# Patient Record
Sex: Female | Born: 1955
Health system: Southern US, Community
[De-identification: ages and names within clinical notes are randomized; demographics above are authoritative.]

## PROBLEM LIST (undated history)

## (undated) DIAGNOSIS — K219 Gastro-esophageal reflux disease without esophagitis: Secondary | ICD-10-CM

## (undated) DIAGNOSIS — K8020 Calculus of gallbladder without cholecystitis without obstruction: Secondary | ICD-10-CM

## (undated) DIAGNOSIS — E785 Hyperlipidemia, unspecified: Secondary | ICD-10-CM

## (undated) DIAGNOSIS — T7840XA Allergy, unspecified, initial encounter: Secondary | ICD-10-CM

## (undated) DIAGNOSIS — F32A Depression, unspecified: Secondary | ICD-10-CM

## (undated) DIAGNOSIS — D649 Anemia, unspecified: Secondary | ICD-10-CM

## (undated) DIAGNOSIS — F411 Generalized anxiety disorder: Secondary | ICD-10-CM

## (undated) DIAGNOSIS — M810 Age-related osteoporosis without current pathological fracture: Secondary | ICD-10-CM

## (undated) HISTORY — DX: Allergy, unspecified, initial encounter: T78.40XA

## (undated) HISTORY — DX: Generalized anxiety disorder: F41.1

## (undated) HISTORY — DX: Calculus of gallbladder without cholecystitis without obstruction: K80.20

## (undated) HISTORY — PX: COLONOSCOPY: SHX174

## (undated) HISTORY — PX: TONSILLECTOMY: SUR1361

## (undated) HISTORY — DX: Depression, unspecified: F32.A

## (undated) HISTORY — PX: CHOLECYSTECTOMY: SHX55

## (undated) HISTORY — DX: Gastro-esophageal reflux disease without esophagitis: K21.9

## (undated) HISTORY — PX: UPPER GASTROINTESTINAL ENDOSCOPY: SHX188

## (undated) HISTORY — PX: BREAST CYST EXCISION: SHX579

## (undated) HISTORY — PX: APPENDECTOMY: SHX54

## (undated) HISTORY — DX: Age-related osteoporosis without current pathological fracture: M81.0

## (undated) HISTORY — DX: Anemia, unspecified: D64.9

## (undated) HISTORY — DX: Hyperlipidemia, unspecified: E78.5

## (undated) HISTORY — PX: HYSTERECTOMY ABDOMINAL WITH SALPINGO-OOPHORECTOMY: SHX6792

---

## 2019-12-11 ENCOUNTER — Ambulatory Visit: Payer: BC Managed Care – PPO | Attending: Internal Medicine

## 2019-12-11 DIAGNOSIS — Z23 Encounter for immunization: Secondary | ICD-10-CM

## 2019-12-11 NOTE — Progress Notes (Signed)
   Covid-19 Vaccination Clinic  Name:  [name]    MRN: [id number] DOB: June 09, 1956  12/11/2019  [name] was observed post Covid-19 immunization for 15 minutes without incident. She was provided with Vaccine Information Sheet and instruction to access the V-Safe system.   [name] was instructed to call 911 with any severe reactions post vaccine: Marland Kitchen Difficulty breathing  . Swelling of face and throat  . A fast heartbeat  . A bad rash all over body  . Dizziness and weakness   Immunizations Administered    Name Date Dose VIS Date Route   Pfizer COVID-19 Vaccine 12/11/2019  9:17 AM 0.3 mL 08/21/2019 Intramuscular   Manufacturer: [employer], [employer]   Lot: [id number]   NDC: [address]-6

## 2019-12-28 ENCOUNTER — Ambulatory Visit: Payer: BC Managed Care – PPO | Admitting: Family Medicine

## 2019-12-28 ENCOUNTER — Other Ambulatory Visit: Payer: Self-pay

## 2019-12-28 ENCOUNTER — Encounter: Payer: Self-pay | Admitting: Family Medicine

## 2019-12-28 VITALS — BP 110/68 | HR 72 | Temp 97.3°F | Wt 162.6 lb

## 2019-12-28 DIAGNOSIS — E785 Hyperlipidemia, unspecified: Secondary | ICD-10-CM | POA: Insufficient documentation

## 2019-12-28 DIAGNOSIS — K219 Gastro-esophageal reflux disease without esophagitis: Secondary | ICD-10-CM | POA: Insufficient documentation

## 2019-12-28 DIAGNOSIS — F419 Anxiety disorder, unspecified: Secondary | ICD-10-CM | POA: Insufficient documentation

## 2019-12-28 DIAGNOSIS — M8580 Other specified disorders of bone density and structure, unspecified site: Secondary | ICD-10-CM | POA: Diagnosis not present

## 2019-12-28 DIAGNOSIS — G4700 Insomnia, unspecified: Secondary | ICD-10-CM | POA: Diagnosis not present

## 2019-12-28 DIAGNOSIS — L729 Follicular cyst of the skin and subcutaneous tissue, unspecified: Secondary | ICD-10-CM | POA: Insufficient documentation

## 2019-12-28 DIAGNOSIS — F17200 Nicotine dependence, unspecified, uncomplicated: Secondary | ICD-10-CM

## 2019-12-28 MED ORDER — ZOLPIDEM TARTRATE 5 MG PO TABS: 5.0000 mg | ORAL_TABLET | Freq: Every evening | ORAL | 1 refills | Status: DC | PRN

## 2019-12-28 NOTE — Progress Notes (Signed)
Subjective:    Patient ID: Kari Rojas, female    DOB: [birth year], 64 y.o.   MRN: [id number]  HPI Chief Complaint  Patient presents with  . new pt    new pt, est, lump on stomach- been there a couple months.    She is new to the practice and here to get established. Moved here from [facility] November 2020. No medical records are with her today.  Previous medical care: PCP in Dr. Estelle June  Originally from [place]. Her daughter and grandchildren moved here.   Other providers: GI in [facility] for colonoscopy and EGD.   Complains of a mass on her right forearm and a knot on her upper abdomen. These are non tender. No erythema or increase in size. Unclear how long they have been present.   HL- taking lovastatin and doing fine.  States she had an echo and EKG 3 years ago and states it was fine.   Hx of GERD- took Prilosec for a while. Now her reflux is under control.   States she had a very stressful job in [employer] and has been taking Lexapro for anxiety and stress. States she would like to wean off of this. Reports her stress has improved since moving here and no longer working.   Insomnia- has been taking Ambien 10 mg for the past 2 years. States she does not take it nightly. States lately she has been taking 5 mg of Ambien instead of 10 mg.  Has not tried any other medications.   Osteopenia vs osteoporosis - she took alendronate but it caused her reflux to become worse. States she did not qualify for Prolia.   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   Smokes 1/2 ppd.   Hysterectomy due to heavy bleeding and fiboids.    Reviewed allergies, medications, past medical, surgical, family, and social history.    Review of Systems Pertinent positives and negatives in the history of present illness.      Objective:   Physical Exam BP 110/68   Pulse 72   Temp (!) 97.3 F (36.3 C)   Wt 162 lb 9.6 oz (73.8 kg)    Alert and in no distress. Cardiac exam shows a regular sinus rhythm without murmurs or gallops. Lungs are clear to auscultation. She has a small round, smooth, moveable and non tender cyst on the upper abdomen. A soft fatty tumor on her right forearm.       Assessment & Plan:  Insomnia, unspecified type - Plan: zolpidem (AMBIEN) 5 MG tablet -discussed reducing her Ambien dose to 5 mg and have her try to wean off. Counseling done on good sleep hygiene. She is napping during the day and I recommend that she stop. Reviewed PDMP.   Anxiety -she would like to wean off of Lexapro. We discussed how to safely do this.   Decreased bone density -unclear as to whether she has osteopenia vs osteoporosis. States she had side effects with alendronate and had to stop it. I will request records from her previous PCP.   Hyperlipidemia, unspecified hyperlipidemia type -taking lovastatin daily. She will return for a fasting CPE in the next 2 months. Continue statin therapy   Cyst of skin-discussed that the upper abdominal cyst is benign appearing. Appears to have a lipoma on her right forearm.   Gastroesophageal reflux disease, unspecified whether esophagitis present -no longer an issue.   Smoker -recommend she stop

## 2019-12-28 NOTE — Patient Instructions (Signed)
Try weaning off the Lexapro by taking 1/2 tablet daily for 1-2 weeks and then 1/2 tablet every other day for at least 1 week before stopping.   Also, I recommend weaning down to 5mg  of the Ambien and then only using it a couple of nights per week and eventually stopping as well.   Use good sleep hygiene as we discussed. Avoid naps during the day.   Follow up with me in the next 2 months for a physical exam and follow up.

## 2020-01-01 ENCOUNTER — Telehealth: Payer: Self-pay | Admitting: Family Medicine

## 2020-01-01 NOTE — Telephone Encounter (Signed)
Received requested records from Dr. Murvin Natal.

## 2020-01-04 ENCOUNTER — Ambulatory Visit: Payer: BC Managed Care – PPO | Attending: Internal Medicine

## 2020-01-04 DIAGNOSIS — Z23 Encounter for immunization: Secondary | ICD-10-CM

## 2020-01-04 NOTE — Progress Notes (Signed)
   Covid-19 Vaccination Clinic  Name:  Kari Rojas    MRN: [id number] DOB: [birth year]  01/04/2020  [name] was observed post Covid-19 immunization for 15 minutes without incident. She was provided with Vaccine Information Sheet and instruction to access the V-Safe system.   [name] was instructed to call 911 with any severe reactions post vaccine: Marland Kitchen Difficulty breathing  . Swelling of face and throat  . A fast heartbeat  . A bad rash all over body  . Dizziness and weakness   Immunizations Administered    Name Date Dose VIS Date Route   Pfizer COVID-19 Vaccine 01/04/2020 11:54 AM 0.3 mL 11/04/2018 Intramuscular   Manufacturer: [employer], [employer]   Lot: [id number]   NDC: [address]-1

## 2020-01-08 ENCOUNTER — Encounter: Payer: Self-pay | Admitting: Family Medicine

## 2020-02-29 NOTE — Progress Notes (Signed)
Subjective:    Patient ID: Kari Rojas, female    DOB: 12-26-1955, 64 y.o.   MRN: [id number]  HPI Chief Complaint  Patient presents with  . fasting cpe    fasting cpe, will find obgyn and will give list   She is fairly new to the practice and here for a complete physical exam. Previous medical care: in [place]. Dr. Marjean Donna  Last CPE: years ago   Other providers: None   Mixed hyperlipidemia- taking a statin daily   Insomnia- states she weaned off Ambien and is doing well.   Depression- states she is taking Lexapro 1/2 tablet every other day. At some point wants to stop it.   Osteoporosis - diagnosed at age 51. Family hx of osteoporosis.  Calcium- Women's One A Day  Vitamin D def- taking a supplement.  Exercise- walks and stairs  Has tried alendronate in the past but it caused severe GI issues. States Prolia was not covered by insurance so she never took it.   GERD- takes omeprazole daily. Endoscopy in [place] in 2012  Echocardiogram 2017 normal results  Requests triamcinolone refill for mosquito bites.    Social history: Lives her daughter and is divorced, has 2 children, retired. Former Programmer, applications in [employer] Smoking 1/2 pack per day, drinking alcohol, drug use  Diet: gets calcium  Excerise: daily walks  Immunizations: Tdap 2020 at an urgent care. Shingles vaccine both doses at [facility] in 2020  Health maintenance:  Mammogram: August 2020 in [facility] Colonoscopy: due this year. This was in [facility]  Last Gynecological Exam: hysterectomy.  DEXA: more than 2 years ago per patient in [facility].   Last Dental Exam: 6 months  Last Eye Exam: years ago   Wears seatbelt always, uses sunscreen, smoke detectors in home and functioning, does not text while driving and feels safe in home environment.   Depression screen [facility] 2/9 03/01/2020 12/28/2019  Decreased Interest 0 0  Down, Depressed, Hopeless 0 0  PHQ - 2 Score 0 0     Reviewed allergies, medications, past medical, surgical, family,  and social history.   Review of Systems Review of Systems Constitutional: -fever, -chills, -sweats, -unexpected weight change,-fatigue ENT: -runny nose, -ear pain, -sore throat Cardiology:  -chest pain, -palpitations, -edema Respiratory: -cough, -shortness of breath, -wheezing Gastroenterology: -abdominal pain, -nausea, -vomiting, -diarrhea, -constipation  Hematology: -bleeding or bruising problems Musculoskeletal: -arthralgias, -myalgias, -joint swelling, -back pain Ophthalmology: -vision changes Urology: -dysuria, -difficulty urinating, -hematuria, -urinary frequency, -urgency Neurology: -headache, -weakness, -tingling, -numbness       Objective:   Physical Exam BP 120/80   Pulse 79   Ht 5\' 5"  (1.651 m)   Wt 164 lb (74.4 kg)   BMI 27.29 kg/m   General Appearance:    Alert, cooperative, no distress, appears stated age  Head:    Normocephalic, without obvious abnormality, atraumatic  Eyes:    PERRL, conjunctiva/corneas clear, EOM's intact  Ears:    Normal TM's and external ear canals  Nose:   Mask in place   Throat:   Mask in place   Neck:   Supple, no lymphadenopathy;  thyroid:  no   enlargement/tenderness/nodules; no JVD  Back:    Spine nontender, no curvature, ROM normal, no CVA     tenderness  Lungs:     Clear to auscultation bilaterally without wheezes, rales or     ronchi; respirations unlabored  Chest Wall:    No tenderness or deformity   Heart:  Regular rate and rhythm, S1 and S2 normal, no murmur, rub   or gallop  Breast Exam:    No tenderness, masses, or nipple discharge or inversion.      No axillary lymphadenopathy  Abdomen:     Soft, non-tender, nondistended, normoactive bowel sounds,    no masses, no hepatosplenomegaly  Genitalia:    Normal external genitalia without lesions.  BUS and vagina normal; cervix not present due to hysterectomy. No abnormal vaginal discharge.  adnexa not enlarged, nontender, no masses.  Pap not indicated      Extremities:   No  clubbing, cyanosis or edema  Pulses:   2+ and symmetric all extremities  Skin:   Skin color, texture, turgor normal, no rashes or lesions  Lymph nodes:   Cervical, supraclavicular, and axillary nodes normal  Neurologic:   CNII-XII intact, normal strength, sensation and gait; reflexes 2+ and symmetric throughout          Psych:   Normal mood, affect, hygiene and grooming.         Assessment & Plan:  Routine general medical examination at a health care facility - Plan: CBC with Differential/Platelet, Comprehensive metabolic panel, TSH, T4, free, Lipid panel -She is fairly new to me and here today for a CPE.  Preventive health care reviewed.  She will call and schedule her mammogram and bone density.  She is due for her colonoscopy and I will refer her to GI.  No results available for previous colonoscopy or mammogram or bone density due to these being done in [place].  I have requested these.  Counseling on healthy lifestyle including diet, exercise, smoking cessation.  Discussed safety and health promotion.  Immunizations reviewed.  Hyperlipidemia, unspecified hyperlipidemia type - Plan: Lipid panel -Continue statin therapy.  Follow-up pending lipid results  Age-related osteoporosis without current pathological fracture - Plan: DG Bone Density -No record on file for her last bone density which was done in [place].  She will call and schedule her bone density.  She reportedly has tried alendronate in the past but this caused severe gastritis.  Prolia was not affordable at the time.  Discussed depending on her results, we may need to consider trying Prolia or another medication.  Encouraged adequate calcium intake, vitamin D supplement and weightbearing exercises  Gastroesophageal reflux disease, unspecified whether esophagitis present -Continue taking PPI as needed.  Try to avoid this daily  Smoker -Encourage smoking cessation.  She is not ready at this time  Anxiety- States her mood is good  and she is currently taking half a dose of Lexapro and plans to wean off at some point.  Insomnia, unspecified type -She stopped taking Ambien.  Sleeping fine.  Vitamin D deficiency - Plan: VITAMIN D 25 Hydroxy (Vit-D Deficiency, Fractures) -Continue on current supplement.  Follow-up pending vitamin D level and adjust dose as appropriate.  Screen for colon cancer - Plan: Ambulatory referral to Gastroenterology -Referral to GI for screening colonoscopy.  She reports having a normal colonoscopy in [place] over 10 years ago.  Encounter for screening mammogram for malignant neoplasm of breast - Plan: MM DIGITAL SCREENING BILATERAL -She will call and schedule her mammogram.  Last 1 was in [place]  Estrogen deficiency - Plan: DG Bone Density -History of osteoporosis.  I do not have records of her last bone density.  She will call and schedule her DEXA  Need for hepatitis C screening test - Plan: Hepatitis C antibody -Done per screening guidelines

## 2020-02-29 NOTE — Patient Instructions (Addendum)
Call and schedule your mammogram and bone density for August when you are due.  You will hear from [facility] to schedule a visit to discuss your colonoscopy.  I will be in touch with your lab results.  Dermatology offices  [facility]: Phone #: [phone] Address: [address], [place],  [place] [address]  [facility]: Phone: [phone]  Address: [address], [place], [place] [address]  [facility]: [phone] Address: [address] [place], [place] [address]  [facility] Address: [place], [place], [place] [address] Phone: [phone]    Preventive Care 27-4 Years Old, Female Preventive care refers to visits with your health care provider and lifestyle choices that can promote health and wellness. This includes:  A yearly physical exam. This may also be called an annual well check.  Regular dental visits and eye exams.  Immunizations.  Screening for certain conditions.  Healthy lifestyle choices, such as eating a healthy diet, getting regular exercise, not using drugs or products that contain nicotine and tobacco, and limiting alcohol use. What can I expect for my preventive care visit? Physical exam Your health care provider will check your:  Height and weight. This may be used to calculate body mass index (BMI), which tells if you are at a healthy weight.  Heart rate and blood pressure.  Skin for abnormal spots. Counseling Your health care provider may ask you questions about your:  Alcohol, tobacco, and drug use.  Emotional well-being.  Home and relationship well-being.  Sexual activity.  Eating habits.  Work and work Statistician.  Method of birth control.  Menstrual cycle.  Pregnancy history. What immunizations do I need?  Influenza (flu) vaccine  This is recommended every year. Tetanus, diphtheria, and pertussis (Tdap) vaccine  You may need a Td booster every 10  years. Varicella (chickenpox) vaccine  You may need this if you have not been vaccinated. Zoster (shingles) vaccine  You may need this after age 37. Measles, mumps, and rubella (MMR) vaccine  You may need at least one dose of MMR if you were born in 1957 or later. You may also need a second dose. Pneumococcal conjugate (PCV13) vaccine  You may need this if you have certain conditions and were not previously vaccinated. Pneumococcal polysaccharide (PPSV23) vaccine  You may need one or two doses if you smoke cigarettes or if you have certain conditions. Meningococcal conjugate (MenACWY) vaccine  You may need this if you have certain conditions. Hepatitis A vaccine  You may need this if you have certain conditions or if you travel or work in places where you may be exposed to hepatitis A. Hepatitis B vaccine  You may need this if you have certain conditions or if you travel or work in places where you may be exposed to hepatitis B. Haemophilus influenzae type b (Hib) vaccine  You may need this if you have certain conditions. Human papillomavirus (HPV) vaccine  If recommended by your health care provider, you may need three doses over 6 months. You may receive vaccines as individual doses or as more than one vaccine together in one shot (combination vaccines). Talk with your health care provider about the risks and benefits of combination vaccines. What tests do I need? Blood tests  Lipid and cholesterol levels. These may be checked every 5 years, or more frequently if you are over 65 years old.  Hepatitis C test.  Hepatitis B test. Screening  Lung cancer screening. You may have this screening every  year starting at age 71 if you have a 30-pack-year history of smoking and currently smoke or have quit within the past 15 years.  Colorectal cancer screening. All adults should have this screening starting at age 96 and continuing until age 82. Your health care provider may  recommend screening at age 80 if you are at increased risk. You will have tests every 1-10 years, depending on your results and the type of screening test.  Diabetes screening. This is done by checking your blood sugar (glucose) after you have not eaten for a while (fasting). You may have this done every 1-3 years.  Mammogram. This may be done every 1-2 years. Talk with your health care provider about when you should start having regular mammograms. This may depend on whether you have a family history of breast cancer.  BRCA-related cancer screening. This may be done if you have a family history of breast, ovarian, tubal, or peritoneal cancers.  Pelvic exam and Pap test. This may be done every 3 years starting at age 49. Starting at age 58, this may be done every 5 years if you have a Pap test in combination with an HPV test. Other tests  Sexually transmitted disease (STD) testing.  Bone density scan. This is done to screen for osteoporosis. You may have this scan if you are at high risk for osteoporosis. Follow these instructions at home: Eating and drinking  Eat a diet that includes fresh fruits and vegetables, whole grains, lean protein, and low-fat dairy.  Take vitamin and mineral supplements as recommended by your health care provider.  Do not drink alcohol if: ? Your health care provider tells you not to drink. ? You are pregnant, may be pregnant, or are planning to become pregnant.  If you drink alcohol: ? Limit how much you have to 0-1 drink a day. ? Be aware of how much alcohol is in your drink. In the [place] one drink equals one 12 oz bottle of beer (355 mL), one 5 oz glass of wine (148 mL), or one 1 oz glass of hard liquor (44 mL). Lifestyle  Take daily care of your teeth and gums.  Stay active. Exercise for at least 30 minutes on 5 or more days each week.  Do not use any products that contain nicotine or tobacco, such as cigarettes, e-cigarettes, and chewing tobacco. If  you need help quitting, ask your health care provider.  If you are sexually active, practice safe sex. Use a condom or other form of birth control (contraception) in order to prevent pregnancy and STIs (sexually transmitted infections).  If told by your health care provider, take low-dose aspirin daily starting at age 71. What's next?  Visit your health care provider once a year for a well check visit.  Ask your health care provider how often you should have your eyes and teeth checked.  Stay up to date on all vaccines. This information is not intended to replace advice given to you by your health care provider. Make sure you discuss any questions you have with your health care provider. Document Revised: 05/08/2018 Document Reviewed: 05/08/2018 Elsevier Patient Education  2020 [employer].

## 2020-03-01 ENCOUNTER — Encounter: Payer: Self-pay | Admitting: Gastroenterology

## 2020-03-01 ENCOUNTER — Encounter: Payer: Self-pay | Admitting: Family Medicine

## 2020-03-01 ENCOUNTER — Ambulatory Visit (INDEPENDENT_AMBULATORY_CARE_PROVIDER_SITE_OTHER): Payer: BC Managed Care – PPO | Admitting: Family Medicine

## 2020-03-01 ENCOUNTER — Other Ambulatory Visit: Payer: Self-pay

## 2020-03-01 VITALS — BP 120/80 | HR 79 | Ht 65.0 in | Wt 164.0 lb

## 2020-03-01 DIAGNOSIS — K219 Gastro-esophageal reflux disease without esophagitis: Secondary | ICD-10-CM | POA: Diagnosis not present

## 2020-03-01 DIAGNOSIS — Z1231 Encounter for screening mammogram for malignant neoplasm of breast: Secondary | ICD-10-CM | POA: Diagnosis not present

## 2020-03-01 DIAGNOSIS — E559 Vitamin D deficiency, unspecified: Secondary | ICD-10-CM | POA: Diagnosis not present

## 2020-03-01 DIAGNOSIS — E785 Hyperlipidemia, unspecified: Secondary | ICD-10-CM | POA: Diagnosis not present

## 2020-03-01 DIAGNOSIS — M810 Age-related osteoporosis without current pathological fracture: Secondary | ICD-10-CM | POA: Insufficient documentation

## 2020-03-01 DIAGNOSIS — Z0000 Encounter for general adult medical examination without abnormal findings: Secondary | ICD-10-CM | POA: Diagnosis not present

## 2020-03-01 DIAGNOSIS — Z1159 Encounter for screening for other viral diseases: Secondary | ICD-10-CM | POA: Diagnosis not present

## 2020-03-01 DIAGNOSIS — G4700 Insomnia, unspecified: Secondary | ICD-10-CM

## 2020-03-01 DIAGNOSIS — E2839 Other primary ovarian failure: Secondary | ICD-10-CM

## 2020-03-01 DIAGNOSIS — F419 Anxiety disorder, unspecified: Secondary | ICD-10-CM

## 2020-03-01 DIAGNOSIS — Z1211 Encounter for screening for malignant neoplasm of colon: Secondary | ICD-10-CM | POA: Diagnosis not present

## 2020-03-01 DIAGNOSIS — F17200 Nicotine dependence, unspecified, uncomplicated: Secondary | ICD-10-CM | POA: Diagnosis not present

## 2020-03-01 MED ORDER — TRIAMCINOLONE ACETONIDE 0.1 % EX CREA: 1.0000 "application " | TOPICAL_CREAM | Freq: Two times a day (BID) | CUTANEOUS | 0 refills | Status: DC

## 2020-03-02 LAB — COMPREHENSIVE METABOLIC PANEL
ALT: 13 IU/L (ref 0–32)
AST: 17 IU/L (ref 0–40)
Albumin/Globulin Ratio: 1.9 (ref 1.2–2.2)
Albumin: 4.5 g/dL (ref 3.8–4.8)
Alkaline Phosphatase: 54 IU/L (ref 48–121)
BUN/Creatinine Ratio: 23 (ref 12–28)
BUN: 17 mg/dL (ref 8–27)
Bilirubin Total: 0.4 mg/dL (ref 0.0–1.2)
CO2: 23 mmol/L (ref 20–29)
Calcium: 9.7 mg/dL (ref 8.7–10.3)
Chloride: 104 mmol/L (ref 96–106)
Creatinine, Ser: 0.73 mg/dL (ref 0.57–1.00)
GFR calc Af Amer: 103 mL/min/{1.73_m2} (ref >59)
GFR calc non Af Amer: 89 mL/min/{1.73_m2} (ref >59)
Globulin, Total: 2.4 g/dL (ref 1.5–4.5)
Glucose: 95 mg/dL (ref 65–99)
Potassium: 4.4 mmol/L (ref 3.5–5.2)
Sodium: 140 mmol/L (ref 134–144)
Total Protein: 6.9 g/dL (ref 6.0–8.5)

## 2020-03-02 LAB — LIPID PANEL
Chol/HDL Ratio: 3.3 ratio (ref 0.0–4.4)
Cholesterol, Total: 196 mg/dL (ref 100–199)
HDL: 60 mg/dL (ref >39)
LDL Chol Calc (NIH): 120 mg/dL — ABNORMAL HIGH (ref 0–99)
Triglycerides: 86 mg/dL (ref 0–149)
VLDL Cholesterol Cal: 16 mg/dL (ref 5–40)

## 2020-03-02 LAB — CBC WITH DIFFERENTIAL/PLATELET
Basophils Absolute: 0.1 10*3/uL (ref 0.0–0.2)
Basos: 1 %
EOS (ABSOLUTE): 0.3 10*3/uL (ref 0.0–0.4)
Eos: 3 %
Hematocrit: 41.7 % (ref 34.0–46.6)
Hemoglobin: 14.3 g/dL (ref 11.1–15.9)
Immature Grans (Abs): 0 10*3/uL (ref 0.0–0.1)
Immature Granulocytes: 0 %
Lymphocytes Absolute: 2.2 10*3/uL (ref 0.7–3.1)
Lymphs: 27 %
MCH: 30.9 pg (ref 26.6–33.0)
MCHC: 34.3 g/dL (ref 31.5–35.7)
MCV: 90 fL (ref 79–97)
Monocytes Absolute: 0.5 10*3/uL (ref 0.1–0.9)
Monocytes: 6 %
Neutrophils Absolute: 5.1 10*3/uL (ref 1.4–7.0)
Neutrophils: 63 %
Platelets: 285 10*3/uL (ref 150–450)
RBC: 4.63 x10E6/uL (ref 3.77–5.28)
RDW: 13.8 % (ref 11.7–15.4)
WBC: 8.2 10*3/uL (ref 3.4–10.8)

## 2020-03-02 LAB — TSH: TSH: 1.93 u[IU]/mL (ref 0.450–4.500)

## 2020-03-02 LAB — T4, FREE: Free T4: 1.04 ng/dL (ref 0.82–1.77)

## 2020-03-02 LAB — VITAMIN D 25 HYDROXY (VIT D DEFICIENCY, FRACTURES): Vit D, 25-Hydroxy: 32 ng/mL (ref 30.0–100.0)

## 2020-03-02 LAB — HEPATITIS C ANTIBODY: Hep C Virus Ab: <0.1 s/co ratio (ref 0.0–0.9)

## 2020-03-02 NOTE — Progress Notes (Signed)
Your labs look quite good including blood counts, electrolytes, kidney, liver, thyroid and vitamin D levels.  Her good cholesterol or HDL is in a good range but her LDL or bad cholesterol is slightly elevated.  Make sure she is limiting fatty foods and fried foods and getting at least 150 minutes of physical activity each week to help with her LDL She is negative for hepatitis C

## 2020-04-08 ENCOUNTER — Encounter: Payer: Self-pay | Admitting: Gastroenterology

## 2020-04-08 ENCOUNTER — Ambulatory Visit (AMBULATORY_SURGERY_CENTER): Payer: Self-pay | Admitting: *Deleted

## 2020-04-08 ENCOUNTER — Other Ambulatory Visit: Payer: Self-pay

## 2020-04-08 VITALS — Ht 65.0 in | Wt 163.0 lb

## 2020-04-08 DIAGNOSIS — Z1211 Encounter for screening for malignant neoplasm of colon: Secondary | ICD-10-CM

## 2020-04-08 MED ORDER — SUTAB 1479-225-188 MG PO TABS: 24.0000 | ORAL_TABLET | ORAL | 0 refills | Status: DC

## 2020-04-08 NOTE — Progress Notes (Signed)
cov vacc x 2   No egg or soy allergy known to patient  No issues with past sedation with any surgeries or procedures no intubation problems in the past  No diet pills per patient No home 02 use per patient  No blood thinners per patient  Pt denies issues with constipation  No A fib or A flutter  EMMI video to pt or MyChart  COVID 19 guidelines implemented in [facility] today   sutab  Coupon given to pt in [facility] today, code to pharmacy    Due to the COVID-19 pandemic we are asking patients to follow these guidelines. Please only bring one care partner. Please be aware that your care partner may wait in the car in the parking lot or if they feel like they will be too hot to wait in the car, they may wait in the lobby on the 4th floor. All care partners are required to wear a mask the entire time (we do not have any that we can provide them), they need to practice social distancing, and we will do a Covid check for all patient's and care partners when you arrive. Also we will check their temperature and your temperature. If the care partner waits in their car they need to stay in the parking lot the entire time and we will call them on their cell phone when the patient is ready for discharge so they can bring the car to the front of the building. Also all patient's will need to wear a mask into building.

## 2020-04-28 ENCOUNTER — Encounter: Payer: Self-pay | Admitting: Gastroenterology

## 2020-04-28 ENCOUNTER — Ambulatory Visit (AMBULATORY_SURGERY_CENTER): Payer: BC Managed Care – PPO | Admitting: Gastroenterology

## 2020-04-28 ENCOUNTER — Other Ambulatory Visit: Payer: Self-pay

## 2020-04-28 VITALS — BP 106/59 | HR 72 | Temp 98.7°F | Resp 16 | Ht 65.0 in | Wt 163.0 lb

## 2020-04-28 DIAGNOSIS — Z1211 Encounter for screening for malignant neoplasm of colon: Secondary | ICD-10-CM | POA: Diagnosis not present

## 2020-04-28 MED ORDER — SODIUM CHLORIDE 0.9 % IV SOLN: 500.0000 mL | Freq: Once | INTRAVENOUS | Status: DC

## 2020-04-28 NOTE — Progress Notes (Signed)
No problems noted in the recovery room. maw 

## 2020-04-28 NOTE — Patient Instructions (Signed)
Handouts were given to you on diverticulosis and hemorrhoids.e You may resume your current medications today. Repeat colonoscopy in 10 years for screening purposes. Please call if any questions or concerns.     YOU HAD AN ENDOSCOPIC PROCEDURE TODAY AT THE [facility]:   Refer to the procedure report that was given to you for any specific questions about what was found during the examination.  If the procedure report does not answer your questions, please call your gastroenterologist to clarify.  If you requested that your care partner not be given the details of your procedure findings, then the procedure report has been included in a sealed envelope for you to review at your convenience later.  YOU SHOULD EXPECT: Some feelings of bloating in the abdomen. Passage of more gas than usual.  Walking can help get rid of the air that was put into your GI tract during the procedure and reduce the bloating. If you had a lower endoscopy (such as a colonoscopy or flexible sigmoidoscopy) you may notice spotting of blood in your stool or on the toilet paper. If you underwent a bowel prep for your procedure, you may not have a normal bowel movement for a few days.  Please Note:  You might notice some irritation and congestion in your nose or some drainage.  This is from the oxygen used during your procedure.  There is no need for concern and it should clear up in a day or so.  SYMPTOMS TO REPORT IMMEDIATELY:   Following lower endoscopy (colonoscopy or flexible sigmoidoscopy):  Excessive amounts of blood in the stool  Significant tenderness or worsening of abdominal pains  Swelling of the abdomen that is new, acute  Fever of 100F or higher    For urgent or emergent issues, a gastroenterologist can be reached at any hour by calling [phone]. Do not use MyChart messaging for urgent concerns.    DIET:  We do recommend a small meal at first, but then you may proceed to your regular  diet.  Drink plenty of fluids but you should avoid alcoholic beverages for 24 hours.  ACTIVITY:  You should plan to take it easy for the rest of today and you should NOT DRIVE or use heavy machinery until tomorrow (because of the sedation medicines used during the test).    FOLLOW UP: Our staff will call the number listed on your records 48-72 hours following your procedure to check on you and address any questions or concerns that you may have regarding the information given to you following your procedure. If we do not reach you, we will leave a message.  We will attempt to reach you two times.  During this call, we will ask if you have developed any symptoms of COVID 19. If you develop any symptoms (ie: fever, flu-like symptoms, shortness of breath, cough etc.) before then, please call [phone].  If you test positive for Covid 19 in the 2 weeks post procedure, please call and report this information to [place].    If any biopsies were taken you will be contacted by phone or by letter within the next 1-3 weeks.  Please call us at [phone] if you have not heard about the biopsies in 3 weeks.    SIGNATURES/CONFIDENTIALITY: You and/or your care partner have signed paperwork which will be entered into your electronic medical record.  These signatures attest to the fact that that the information above on your After Visit Summary has been reviewed and  is understood.  Full responsibility of the confidentiality of this discharge information lies with you and/or your care-partner.

## 2020-04-28 NOTE — Progress Notes (Signed)
Report to PACU, RN, vss, BBS= Clear.  

## 2020-04-28 NOTE — Op Note (Signed)
[facility] Patient Name: Tritia Endo Procedure Date: 04/28/2020 11:07 AM MRN: [id number] Endoscopist: Napoleon Form , MD Age: 64 Referring MD:  Date of Birth: 1955-10-30 Gender: Female Account #: [id number] Procedure:                Colonoscopy Indications:              Screening for colorectal malignant neoplasm Medicines:                Monitored Anesthesia Care Procedure:                Pre-Anesthesia Assessment:                           - Prior to the procedure, a History and Physical                            was performed, and patient medications and                            allergies were reviewed. The patient's tolerance of                            previous anesthesia was also reviewed. The risks                            and benefits of the procedure and the sedation                            options and risks were discussed with the patient.                            All questions were answered, and informed consent                            was obtained. Prior Anticoagulants: The patient has                            taken no previous anticoagulant or antiplatelet                            agents. ASA Grade Assessment: II - A patient with                            mild systemic disease. After reviewing the risks                            and benefits, the patient was deemed in                            satisfactory condition to undergo the procedure.                           After obtaining informed consent, the colonoscope  was passed under direct vision. Throughout the                            procedure, the patient's blood pressure, pulse, and                            oxygen saturations were monitored continuously. The                            Colonoscope was introduced through the anus and                            advanced to the the cecum, identified by                            appendiceal  orifice and ileocecal valve. The                            colonoscopy was performed without difficulty. The                            patient tolerated the procedure well. The quality                            of the bowel preparation was good. The ileocecal                            valve, appendiceal orifice, and rectum were                            photographed. Scope In: 11:15:26 AM Scope Out: 11:29:57 AM Scope Withdrawal Time: 0 hours 10 minutes 48 seconds  Total Procedure Duration: 0 hours 14 minutes 31 seconds  Findings:                 The perianal and digital rectal examinations were                            normal.                           Multiple small-mouthed diverticula were found in                            the sigmoid colon.                           Non-bleeding internal hemorrhoids were found during                            retroflexion. The hemorrhoids were small.                           The exam was otherwise without abnormality. Complications:            No immediate complications. Estimated Blood Loss:     Estimated  blood loss was minimal. Impression:               - Diverticulosis in the sigmoid colon.                           - Non-bleeding internal hemorrhoids.                           - The examination was otherwise normal.                           - No specimens collected. Recommendation:           - Patient has a contact number available for                            emergencies. The signs and symptoms of potential                            delayed complications were discussed with the                            patient. Return to normal activities tomorrow.                            Written discharge instructions were provided to the                            patient.                           - Resume previous diet.                           - Continue present medications.                           - Repeat colonoscopy in 10 years  for screening                            purposes. Napoleon Form, MD 04/28/2020 11:33:19 AM This report has been signed electronically.

## 2020-05-02 ENCOUNTER — Telehealth: Payer: Self-pay

## 2020-05-02 NOTE — Telephone Encounter (Signed)
  Follow up Call-  Call back number 04/28/2020  Post procedure Call Back phone  # [phone]  Permission to leave phone message Yes     Patient questions:  Do you have a fever, pain , or abdominal swelling? No. Pain Score  0 *  Have you tolerated food without any problems? Yes.    Have you been able to return to your normal activities? Yes.    Do you have any questions about your discharge instructions: Diet   No. Medications  No. Follow up visit  No.  Do you have questions or concerns about your Care? No.  Actions: * If pain score is 4 or above: 1. No action needed, pain <4.Have you developed a fever since your procedure? no  2.   Have you had an respiratory symptoms (SOB or cough) since your procedure? no  3.   Have you tested positive for COVID 19 since your procedure no  4.   Have you had any family members/close contacts diagnosed with the COVID 19 since your procedure?  no   If yes to any of these questions please route to Laverna Peace, RN and Karlton Lemon, RN

## 2020-05-04 ENCOUNTER — Other Ambulatory Visit: Payer: Self-pay

## 2020-05-04 ENCOUNTER — Ambulatory Visit
Admission: RE | Admit: 2020-05-04 | Discharge: 2020-05-04 | Disposition: A | Discharge status: Home / Self Care | Payer: BC Managed Care – PPO | Source: Ambulatory Visit | Attending: Family Medicine | Admitting: Family Medicine

## 2020-05-04 DIAGNOSIS — Z1231 Encounter for screening mammogram for malignant neoplasm of breast: Secondary | ICD-10-CM

## 2020-05-23 ENCOUNTER — Encounter: Payer: Self-pay | Admitting: Family Medicine

## 2020-05-23 ENCOUNTER — Other Ambulatory Visit (INDEPENDENT_AMBULATORY_CARE_PROVIDER_SITE_OTHER): Payer: BC Managed Care – PPO

## 2020-05-23 ENCOUNTER — Telehealth: Payer: BC Managed Care – PPO | Admitting: Family Medicine

## 2020-05-23 ENCOUNTER — Other Ambulatory Visit: Payer: Self-pay

## 2020-05-23 VITALS — Temp 100.8°F | Wt 160.0 lb

## 2020-05-23 DIAGNOSIS — R0602 Shortness of breath: Secondary | ICD-10-CM

## 2020-05-23 DIAGNOSIS — R509 Fever, unspecified: Secondary | ICD-10-CM

## 2020-05-23 DIAGNOSIS — R05 Cough: Secondary | ICD-10-CM

## 2020-05-23 DIAGNOSIS — R52 Pain, unspecified: Secondary | ICD-10-CM | POA: Diagnosis not present

## 2020-05-23 DIAGNOSIS — F17200 Nicotine dependence, unspecified, uncomplicated: Secondary | ICD-10-CM | POA: Diagnosis not present

## 2020-05-23 DIAGNOSIS — R059 Cough, unspecified: Secondary | ICD-10-CM

## 2020-05-23 DIAGNOSIS — R058 Other specified cough: Secondary | ICD-10-CM

## 2020-05-23 LAB — POC COVID19 BINAXNOW: SARS Coronavirus 2 Ag: NEGATIVE

## 2020-05-23 LAB — POCT INFLUENZA A/B
Influenza A, POC: NEGATIVE
Influenza B, POC: NEGATIVE

## 2020-05-23 MED ORDER — GUAIFENESIN-DM 100-10 MG/5ML PO SYRP: 5.0000 mL | ORAL_SOLUTION | ORAL | 0 refills | Status: DC | PRN

## 2020-05-23 MED ORDER — AZITHROMYCIN 250 MG PO TABS: ORAL_TABLET | ORAL | 0 refills | Status: DC

## 2020-05-23 NOTE — Progress Notes (Signed)
° °  Subjective:  Documentation for virtual audio and video telecommunications through Caregility encounter:  She also came to the office for testing, Covid and flu  The patient was located at home. 2 patient identifiers used.  The provider was located in the office. The patient did consent to this visit and is aware of possible charges through their insurance for this visit.  The other persons participating in this telemedicine service were none. Time spent on call was 16 minutes and in review of previous records 20 minutes total.  This virtual service is not related to other E/M service within previous 7 days.   Patient ID: Kari Rojas, female    DOB: 1956/04/30, 64 y.o.   MRN: [id number]  HPI Chief Complaint  Patient presents with   cold symptoms    chest congestion, cough, fever- 101.4, stuffy nose, drainage, some SOB. x 2 days ago   Complains of cold symptoms 2 days ago including rhinorrhea, nasal congestion and then yesterday she developed fever, chills, body aches, cough with yellow, thick sputum, chest congestion and shortness of breath.   She came to the office earlier today for Covid and flu testing which were negative. PCR was sent.   She is fully vaccinated for Covid.  She was around her grandchildren and one was sick with URI symptoms.  Smokes   Hx of pneumonia and bronchitis.   Reviewed allergies, medications, past medical, surgical, family, and social history.  Request prescription cough medication.   No loss of taste or smell.  No sore throat, chest pain, palpitations, abdominal pain, N/V/D.     Review of Systems Pertinent positives and negatives in the history of present illness.     Objective:   Physical Exam Temp (!) 100.8 F (38.2 C)    Wt 160 lb (72.6 kg)    BMI 26.63 kg/m   Alert and oriented and in no acute distress.  Respirations unlabored.  Speaking in complete senses without difficulty.  Normal speech, mood and thought process        Assessment & Plan:  Cough with fever - Plan: azithromycin (ZITHROMAX) 250 MG tablet, guaiFENesin-dextromethorphan (ROBITUSSIN DM) 100-10 MG/5ML syrup  Productive cough  Body aches  Shortness of breath  Smoker  Negative rapid Covid and flu test. PCR Covid testing sent She is not in any acute distress. History of pneumonia, bronchitis and she is a smoker.  I will prescribe a Z-Pak as well as Robitussin-DM.  Discussed supportive care including hydration, Tylenol or ibuprofen, rest.  I also recommend isolation until we have her PCR Covid test back. Encouraged good multivitamins including zinc and vitamin C. Discussed benefit versus risk in regards to starting her on antibiotic this early into her illness. Follow-up pending PCR test and her symptoms.

## 2020-05-24 LAB — NOVEL CORONAVIRUS, NAA: SARS-CoV-2, NAA: NOT DETECTED

## 2020-05-24 LAB — SARS-COV-2, NAA 2 DAY TAT

## 2020-05-25 ENCOUNTER — Telehealth: Payer: Self-pay | Admitting: Family Medicine

## 2020-05-25 MED ORDER — ALBUTEROL SULFATE HFA 108 (90 BASE) MCG/ACT IN AERS: 2.0000 | INHALATION_SPRAY | Freq: Four times a day (QID) | RESPIRATORY_TRACT | 0 refills | Status: DC | PRN

## 2020-05-25 NOTE — Telephone Encounter (Signed)
Pt called and said the meds that you prescribed were working but she is still having trouble breathing so she wants to see if she get an albuterol inhaler called in

## 2020-05-25 NOTE — Telephone Encounter (Signed)
Ok to send in albuterol inhaler but if this does not help, she may need to be seen at an urgent care

## 2020-05-25 NOTE — Telephone Encounter (Signed)
Pt was notified.  

## 2020-06-08 ENCOUNTER — Telehealth: Payer: Self-pay

## 2020-06-08 MED ORDER — LOVASTATIN 10 MG PO TABS
10.0000 mg | ORAL_TABLET | Freq: Every day | ORAL | 5 refills | Status: DC
Start: 2020-06-08 — End: 2020-10-06

## 2020-06-08 NOTE — Telephone Encounter (Signed)
Sent in med

## 2020-06-08 NOTE — Telephone Encounter (Signed)
Pt. Called stating that she needs a refill on her Lovastatin didn't get refilled last time because she still had refills from her previous doctor in [facility]. She does need it ref[facility] now to [facility] [address] Pt. Last apt was 05/23/20 next apt 03/03/21.

## 2020-06-08 NOTE — Telephone Encounter (Signed)
Ok to refill for 6 months 

## 2020-06-12 ENCOUNTER — Other Ambulatory Visit: Payer: Self-pay | Admitting: Family Medicine

## 2020-06-13 MED ORDER — ALBUTEROL SULFATE HFA 108 (90 BASE) MCG/ACT IN AERS: 2.0000 | INHALATION_SPRAY | Freq: Four times a day (QID) | RESPIRATORY_TRACT | 0 refills | Status: DC | PRN

## 2020-06-13 NOTE — Addendum Note (Signed)
Addended by: Jac Canavan on: 06/13/2020 11:00 AM   Modules accepted: Orders

## 2020-06-13 NOTE — Telephone Encounter (Signed)
This was just refill a week ago

## 2020-07-08 ENCOUNTER — Other Ambulatory Visit: Payer: Self-pay | Admitting: Medical

## 2020-10-06 ENCOUNTER — Other Ambulatory Visit: Payer: Self-pay | Admitting: Family Medicine

## 2020-12-01 DIAGNOSIS — M25562 Pain in left knee: Secondary | ICD-10-CM | POA: Diagnosis not present

## 2020-12-29 ENCOUNTER — Other Ambulatory Visit: Payer: Self-pay

## 2020-12-29 ENCOUNTER — Ambulatory Visit (HOSPITAL_COMMUNITY)
Admission: EM | Admit: 2020-12-29 | Discharge: 2020-12-29 | Disposition: A | Discharge status: Home / Self Care | Payer: BC Managed Care – PPO | Attending: Emergency Medicine | Admitting: Emergency Medicine

## 2020-12-29 ENCOUNTER — Encounter (HOSPITAL_COMMUNITY): Payer: Self-pay | Admitting: Emergency Medicine

## 2020-12-29 ENCOUNTER — Ambulatory Visit (HOSPITAL_COMMUNITY): Payer: Self-pay

## 2020-12-29 DIAGNOSIS — R1084 Generalized abdominal pain: Secondary | ICD-10-CM | POA: Diagnosis not present

## 2020-12-29 DIAGNOSIS — K529 Noninfective gastroenteritis and colitis, unspecified: Secondary | ICD-10-CM | POA: Diagnosis not present

## 2020-12-29 LAB — POCT URINALYSIS DIPSTICK, ED / UC
Bilirubin Urine: NEGATIVE
Glucose, UA: NEGATIVE mg/dL
Ketones, ur: NEGATIVE mg/dL
Leukocytes,Ua: NEGATIVE
Nitrite: NEGATIVE
Protein, ur: NEGATIVE mg/dL
Specific Gravity, Urine: 1.01 (ref 1.005–1.030)
Urobilinogen, UA: 0.2 mg/dL (ref 0.0–1.0)
pH: 6 (ref 5.0–8.0)

## 2020-12-29 LAB — CBC
HCT: 41.5 % (ref 36.0–46.0)
Hemoglobin: 14 g/dL (ref 12.0–15.0)
MCH: 30.9 pg (ref 26.0–34.0)
MCHC: 33.7 g/dL (ref 30.0–36.0)
MCV: 91.6 fL (ref 80.0–100.0)
Platelets: 320 10*3/uL (ref 150–400)
RBC: 4.53 MIL/uL (ref 3.87–5.11)
RDW: 13.5 % (ref 11.5–15.5)
WBC: 11.9 10*3/uL — ABNORMAL HIGH (ref 4.0–10.5)
nRBC: 0 % (ref 0.0–0.2)

## 2020-12-29 MED ORDER — METRONIDAZOLE 500 MG PO TABS: 500.0000 mg | ORAL_TABLET | Freq: Three times a day (TID) | ORAL | 0 refills | Status: AC

## 2020-12-29 MED ORDER — CIPROFLOXACIN HCL 500 MG PO TABS: 500.0000 mg | ORAL_TABLET | Freq: Two times a day (BID) | ORAL | 0 refills | Status: AC

## 2020-12-29 NOTE — ED Triage Notes (Signed)
Abdominal pain and bloating for a week.  Took dulcolax and no relief.  Continued with dulcolax, enema, miralax with minimal results and some relief, but symptoms started worsening yesterday.  Pain is lower abdomen and now having left lower back pain.  Denies urinary symptoms

## 2020-12-29 NOTE — Discharge Instructions (Signed)
Take the Cipro twice a day for the next 14 days and the Flagyl 3 times a day for the next 14 days.  Make sure you are drinking plenty of fluids especially water.  Try to increase your dietary fiber intake.  Follow-up with primary care or go to the Emergency Department if symptoms worsen or do not improve in the next few days.

## 2020-12-29 NOTE — ED Provider Notes (Addendum)
[facility]    CSN: [id number] Arrival date & time: 12/29/20  8469      History   Chief Complaint Chief Complaint  Patient presents with  . Abdominal Pain    HPI Kari Rojas is a 65 y.o. female.   Here for evaluation of generalized abdominal pain and bloating that has been ongoing since last Friday.  Reports last normal bowel movement last Thursday.  Reports taking a Dulcolax on Friday with minimal relief.  Has since tried MiraLAX, enemas, and magnesium citrate.  Reports having small stools but no real significant relief.  Denies any vomiting but does report nausea that started this morning.  Reports having the occasional constipation in the past but nothing this severe.  Denies any significant changes to diet. Denies any specific alleviating or aggravating factors.  Denies any fevers, chest pain, shortness of breath, N/V/D, numbness, tingling, weakness, or headaches.   ROS: As per HPI, all other pertinent ROS negative   The history is provided by the patient.    Past Medical History:  Diagnosis Date  . Allergy   . Anemia    younger with menses   . Depression   . GAD (generalized anxiety disorder)   . GERD (gastroesophageal reflux disease)   . Hyperlipidemia   . Osteoporosis     Patient Active Problem List   Diagnosis Date Noted  . Age-related osteoporosis without current pathological fracture 03/01/2020  . Vitamin D deficiency 03/01/2020  . Anxiety 12/28/2019  . Insomnia 12/28/2019  . Decreased bone density 12/28/2019  . Hyperlipidemia 12/28/2019  . Cyst of skin 12/28/2019  . Gastroesophageal reflux disease 12/28/2019  . Smoker 12/28/2019    Past Surgical History:  Procedure Laterality Date  . APPENDECTOMY    . BREAST CYST EXCISION Right    40 + years ago, Benign  . CHOLECYSTECTOMY    . COLONOSCOPY     10 + yrs ago   . HYSTERECTOMY ABDOMINAL WITH SALPINGO-OOPHORECTOMY     has one ovary   . TONSILLECTOMY    . UPPER GASTROINTESTINAL  ENDOSCOPY      OB History   No obstetric history on file.      Home Medications    Prior to Admission medications   Medication Sig Start Date End Date Taking? Authorizing Provider  ciprofloxacin (CIPRO) 500 MG tablet Take 1 tablet (500 mg total) by mouth every 12 (twelve) hours for 14 days. 12/29/20 01/12/21 Yes Ivette Loyal, NP  escitalopram (LEXAPRO) 20 MG tablet Take 20 mg by mouth daily.   Yes [provider]  lovastatin (MEVACOR) 10 MG tablet TAKE 1 TABLET BY MOUTH EVERYDAY AT BEDTIME 10/06/20  Yes Henson, Vickie L, NP-C  metroNIDAZOLE (FLAGYL) 500 MG tablet Take 1 tablet (500 mg total) by mouth 3 (three) times daily for 14 days. 12/29/20 01/12/21 Yes Ivette Loyal, NP  Multiple Vitamins-Minerals (ONE-A-DAY WOMENS PO) Take by mouth.   Yes [provider]  omeprazole (PRILOSEC) 20 MG capsule Take 20 mg by mouth daily.   Yes [provider]  albuterol (VENTOLIN HFA) 108 (90 Base) MCG/ACT inhaler TAKE 2 PUFFS BY MOUTH EVERY 6 HOURS AS NEEDED FOR WHEEZE OR SHORTNESS OF BREATH 07/10/20   Henson, Vickie L, NP-C  guaiFENesin-dextromethorphan (ROBITUSSIN DM) 100-10 MG/5ML syrup Take 5 mLs by mouth every 4 (four) hours as needed for cough. 05/23/20   Henson, Vickie L, NP-C  triamcinolone cream (KENALOG) 0.1 % Apply 1 application topically 2 (two) times daily. 03/01/20   Suezanne Jacquet,  Vickie L, NP-C    Family History Family History  Adopted: Yes    Social History Social History   Tobacco Use  . Smoking status: Current Every Day Smoker    Packs/day: 0.50    Years: 15.00    Pack years: 7.50  . Smokeless tobacco: Never Used  Vaping Use  . Vaping Use: Never used  Substance Use Topics  . Alcohol use: Yes    Comment: occ  . Drug use: Never     Allergies   Latex and Penicillins   Review of Systems Review of Systems  Gastrointestinal: Positive for abdominal distention, abdominal pain, constipation and nausea.  Genitourinary: Positive for flank pain. Negative  for difficulty urinating, dysuria and urgency.  All other systems reviewed and are negative.    Physical Exam Triage Vital Signs ED Triage Vitals  Enc Vitals Group     BP 12/29/20 1001 130/73     Pulse Rate 12/29/20 1001 91     Resp 12/29/20 1001 18     Temp 12/29/20 1001 98 F (36.7 C)     Temp Source 12/29/20 1001 Oral     SpO2 12/29/20 1001 99 %     Weight --      Height --      Head Circumference --      Peak Flow --      Pain Score 12/29/20 0957 6     Pain Loc --      Pain Edu? --      Excl. in GC? --    No data found.  Updated Vital Signs BP 130/73 (BP Location: Right Arm)   Pulse 91   Temp 98 F (36.7 C) (Oral)   Resp 18   SpO2 99%   Visual Acuity Right Eye Distance:   Left Eye Distance:   Bilateral Distance:    Right Eye Near:   Left Eye Near:    Bilateral Near:     Physical Exam Vitals and nursing note reviewed.  Constitutional:      General: She is not in acute distress.    Appearance: Normal appearance. She is not ill-appearing, toxic-appearing or diaphoretic.  HENT:     Head: Normocephalic and atraumatic.  Eyes:     Conjunctiva/sclera: Conjunctivae normal.  Cardiovascular:     Rate and Rhythm: Normal rate.     Pulses: Normal pulses.  Pulmonary:     Effort: Pulmonary effort is normal.  Abdominal:     General: Abdomen is flat. Bowel sounds are normal.     Palpations: Abdomen is soft.     Tenderness: There is generalized abdominal tenderness and tenderness in the epigastric area and suprapubic area. There is guarding. There is no rebound.     Hernia: No hernia is present.  Musculoskeletal:        General: Normal range of motion.     Cervical back: Normal range of motion.  Skin:    General: Skin is warm and dry.  Neurological:     General: No focal deficit present.     Mental Status: She is alert and oriented to person, place, and time.  Psychiatric:        Mood and Affect: Mood normal.      UC Treatments / Results  Labs (all labs  ordered are listed, but only abnormal results are displayed) Labs Reviewed  POCT URINALYSIS DIPSTICK, ED / UC - Abnormal; Notable for the following components:      Result Value  Hgb urine dipstick MODERATE (*)    All other components within normal limits  CBC    EKG   Radiology No results found.  Procedures Procedures (including critical care time)  Medications Ordered in UC Medications - No data to display  Initial Impression / Assessment and Plan / UC Course  I have reviewed the triage vital signs and the nursing notes.  Pertinent labs & imaging results that were available during my care of the patient were reviewed by me and considered in my medical decision making (see chart for details).    Assessment is negative for red flags or concerns including appendicitis.  This is likely colitis or inflammation of the colon.  Urinalysis with no signs of infection. CBC pending, will follow up with abnormal results. Prescribe Cipro and Flagyl for the next 14 days.  Encourage fluid intake and increasing amount of fiber in diet.  Encourage patient to follow-up if symptoms worsen or do not improve in the next few days.  Final Clinical Impressions(s) / UC Diagnoses   Final diagnoses:  Colitis  Generalized abdominal pain     Discharge Instructions     Take the Cipro twice a day for the next 14 days and the Flagyl 3 times a day for the next 14 days.  Make sure you are drinking plenty of fluids especially water.  Try to increase your dietary fiber intake.  Follow-up with primary care or go to the Emergency Department if symptoms worsen or do not improve in the next few days.      ED Prescriptions    Medication Sig Dispense Auth. Provider   ciprofloxacin (CIPRO) 500 MG tablet Take 1 tablet (500 mg total) by mouth every 12 (twelve) hours for 14 days. 28 tablet Chales Salmon R, NP   metroNIDAZOLE (FLAGYL) 500 MG tablet Take 1 tablet (500 mg total) by mouth 3 (three) times daily  for 14 days. 42 tablet Ivette Loyal, NP     PDMP not reviewed this encounter.   Ivette Loyal, NP 12/29/20 1038    Ivette Loyal, NP 12/29/20 1052

## 2021-01-06 ENCOUNTER — Other Ambulatory Visit: Payer: Self-pay | Admitting: Family Medicine

## 2021-01-06 NOTE — Telephone Encounter (Signed)
Has an app in june

## 2021-02-15 ENCOUNTER — Telehealth: Payer: Self-pay | Admitting: Family Medicine

## 2021-02-15 NOTE — Telephone Encounter (Signed)
Called pt to move up appt time on 6/23 and she said ok.  Pt said she needs refill of Citalopram 20 [facility] to [facility] [address]

## 2021-02-16 MED ORDER — ESCITALOPRAM OXALATE 20 MG PO TABS: 20.0000 mg | ORAL_TABLET | Freq: Every day | ORAL | 0 refills | Status: DC

## 2021-02-16 NOTE — Telephone Encounter (Signed)
done

## 2021-02-20 ENCOUNTER — Encounter: Payer: Self-pay | Admitting: Internal Medicine

## 2021-03-02 ENCOUNTER — Encounter: Payer: Self-pay | Admitting: Family Medicine

## 2021-03-03 ENCOUNTER — Encounter: Payer: Self-pay | Admitting: Family Medicine

## 2021-03-03 ENCOUNTER — Other Ambulatory Visit: Payer: Self-pay

## 2021-03-03 ENCOUNTER — Ambulatory Visit: Payer: BC Managed Care – PPO | Admitting: Family Medicine

## 2021-03-03 VITALS — BP 128/80 | HR 80 | Temp 98.3°F | Ht 63.5 in | Wt 159.2 lb

## 2021-03-03 DIAGNOSIS — Z1329 Encounter for screening for other suspected endocrine disorder: Secondary | ICD-10-CM | POA: Diagnosis not present

## 2021-03-03 DIAGNOSIS — F17200 Nicotine dependence, unspecified, uncomplicated: Secondary | ICD-10-CM

## 2021-03-03 DIAGNOSIS — Z0000 Encounter for general adult medical examination without abnormal findings: Secondary | ICD-10-CM | POA: Diagnosis not present

## 2021-03-03 DIAGNOSIS — E785 Hyperlipidemia, unspecified: Secondary | ICD-10-CM

## 2021-03-03 DIAGNOSIS — E559 Vitamin D deficiency, unspecified: Secondary | ICD-10-CM | POA: Diagnosis not present

## 2021-03-03 DIAGNOSIS — Z23 Encounter for immunization: Secondary | ICD-10-CM | POA: Diagnosis not present

## 2021-03-03 DIAGNOSIS — L729 Follicular cyst of the skin and subcutaneous tissue, unspecified: Secondary | ICD-10-CM

## 2021-03-03 DIAGNOSIS — M810 Age-related osteoporosis without current pathological fracture: Secondary | ICD-10-CM | POA: Diagnosis not present

## 2021-03-03 DIAGNOSIS — R319 Hematuria, unspecified: Secondary | ICD-10-CM | POA: Diagnosis not present

## 2021-03-03 DIAGNOSIS — F419 Anxiety disorder, unspecified: Secondary | ICD-10-CM

## 2021-03-03 DIAGNOSIS — E2839 Other primary ovarian failure: Secondary | ICD-10-CM

## 2021-03-03 LAB — POCT URINALYSIS DIP (PROADVANTAGE DEVICE)
Bilirubin, UA: NEGATIVE
Glucose, UA: NEGATIVE mg/dL
Ketones, POC UA: NEGATIVE mg/dL
Leukocytes, UA: NEGATIVE
Nitrite, UA: NEGATIVE
Protein Ur, POC: NEGATIVE mg/dL
Specific Gravity, Urine: 1.015
Urobilinogen, Ur: 0.2
pH, UA: 6.5 (ref 5.0–8.0)

## 2021-03-03 NOTE — Progress Notes (Signed)
Subjective:    Patient ID: Kari Rojas, female    DOB: 15-Nov-1955, 65 y.o.   MRN: [id number]  HPI Chief Complaint  Patient presents with   Annual Exam    CPE lump on rt. arm and stomach no other issues, no obgyn had pap here last year   She is here for a complete physical exam.   Other providers: GI - Dr. Lavon Paganini   Recent issue with colitis and is taking probiotic now.   Mixed hyperlipidemia- taking a statin daily    Insomnia- states she weaned off Ambien and is doing well. has a nightly routine. Takes 5 mg of melatonin.    Anxiety- states she is taking Lexapro 1 tablet daily. doing well. Tried to stop it and anxiety got worse.    Osteoporosis - diagnosed at age 63. Family hx of osteoporosis. Calcium- Women's One A Day Vitamin D def- taking a supplement. Exercise- walks and stairs Has tried alendronate in the past but it caused severe GI issues. States Prolia was not covered by insurance so she never took it. She did not get a DEXA last year as recommended.    GERD- takes omeprazole rarely now.  Endoscopy in [place] in 2012   Echocardiogram 2017 normal results  Check urine due to hx of hematuria.   Working on stopping smoking.   States she is ready to have something done about the cysts under her skin on her right arm and abdominal wall. She plans to [place] dermat[facility]    Social history: Lives with her daughter, is not working Diet: but back on red meat. Eating more fish.  Excerise:   Immunizations: Tdap 2020 at an urgent care. Shingles vaccine both doses at [facility] [place] in 2020 2 Pfizer Covid vaccines.   Health maintenance:   Mammogram: 04/2020 Colonoscopy: 04/2020 and 10 year recall needed  Last Gynecological Exam: hysterectomy  DEXA: over due  Last Dental Exam: has an appt  Last Eye Exam: 2 months ago   Wears seatbelt always, uses sunscreen, smoke detectors in home and functioning, does not text while driving and feels safe in home environment.   Reviewed  allergies, medications, past medical, surgical, family, and social history.   Review of Systems Review of Systems Constitutional: -fever, -chills, -sweats, -unexpected weight change,-fatigue ENT: -runny nose, -ear pain, -sore throat Cardiology:  -chest pain, -palpitations, -edema Respiratory: -cough, -shortness of breath, -wheezing Gastroenterology: -abdominal pain, -nausea, -vomiting, -diarrhea, -constipation  Hematology: -bleeding or bruising problems Musculoskeletal: -arthralgias, -myalgias, -joint swelling, -back pain Ophthalmology: -vision changes Urology: -dysuria, -difficulty urinating, -hematuria, -urinary frequency, -urgency Neurology: -headache, -weakness, -tingling, -numbness       Objective:   Physical Exam BP 128/80   Pulse 80   Temp 98.3 F (36.8 C)   Ht 5' 3.5" (1.613 m)   Wt 159 lb 3.2 oz (72.2 kg)   BMI 27.76 kg/m   General Appearance:    Alert, cooperative, no distress, appears stated age  Head:    Normocephalic, without obvious abnormality, atraumatic  Eyes:    PERRL, conjunctiva/corneas clear, EOM's intact   Ears:    Normal TM's and external ear canals  Nose:   Mask on   Throat:   Mask on   Neck:   Supple, no lymphadenopathy;  thyroid:  no   enlargement/tenderness/nodules; no JVD  Back:    Spine nontender, no curvature, ROM normal, no CVA     tenderness  Lungs:     Clear to auscultation bilaterally without wheezes, rales  or     ronchi; respirations unlabored  Chest Wall:    No tenderness or deformity   Heart:    Regular rate and rhythm, S1 and S2 normal, no murmur, rub   or gallop  Breast Exam:    Declines   Abdomen:     Soft, non-tender, nondistended, normoactive bowel sounds,    no masses, no hepatosplenomegaly  Genitalia:    Declines      Extremities:   No clubbing, cyanosis or edema  Pulses:   2+ and symmetric all extremities  Skin:   Skin color, texture, turgor normal, no rashes or lesions. Small, round, moveable, non tender cyst on right  lateral forearm and upper abdominal wall.   Lymph nodes:   Cervical, supraclavicular, and axillary nodes normal  Neurologic:   CNII-XII intact, normal strength, sensation and gait          Psych:   Normal mood, affect, hygiene and grooming.        Assessment & Plan:  Routine general medical examination at a health care facility - Plan: CBC with Differential/Platelet, Comprehensive metabolic panel, POCT Urinalysis DIP (Proadvantage Device), TSH, T4, free, T3, Lipid panel -Preventive health care reviewed.  Counseled on healthy lifestyle including diet and exercise.  Recommend regular dental and eye exams.  Recommend she call and schedule her DEXA and mammogram together this year.  Immunizations reviewed.  Counseling on safety.  Age-related osteoporosis without current pathological fracture - Plan: DG Bone Density -Continue getting adequate calcium in her diet, taking a vitamin D supplement and weightbearing exercises.  She did not get the DEXA done last year and will call and schedule this.  She has not tolerated oral bisphosphonates in the past so we will consider Prolia  Vitamin D deficiency - Plan: VITAMIN D 25 Hydroxy (Vit-D Deficiency, Fractures) -Follow-up pending results  Hyperlipidemia, unspecified hyperlipidemia type - Plan: Lipid panel -Continue statin therapy.  She is aware that if her cholesterol is not well controlled we will need to increase her dose or switch to a different statin.  Recommend low-fat, low-cholesterol diet and increasing physical activity  Smoker -She is contemplating stopping smoking.  I did encourage her to to stop  Anxiety -Continue medication  Cyst of skin -She will call and schedule with her dermatologist  Estrogen deficiency - Plan: DG Bone Density  Screening for thyroid disorder - Plan: TSH, T4, free, T3  Hematuria, unspecified type - Plan: Urine Microscopic -Asymptomatic.  Follow-up pending microscopic urinalysis

## 2021-03-03 NOTE — Patient Instructions (Signed)
Preventive Care 63-63 Years Old, Female Preventive care refers to lifestyle choices and visits with your health care provider that can promote health and wellness. This includes: A yearly physical exam. This is also called an annual wellness visit. Regular dental and eye exams. Immunizations. Screening for certain conditions. Healthy lifestyle choices, such as: Eating a healthy diet. Getting regular exercise. Not using drugs or products that contain nicotine and tobacco. Limiting alcohol use. What can I expect for my preventive care visit? Physical exam Your health care provider will check your: Height and weight. These may be used to calculate your BMI (body mass index). BMI is a measurement that tells if you are at a healthy weight. Heart rate and blood pressure. Body temperature. Skin for abnormal spots. Counseling Your health care provider may ask you questions about your: Past medical problems. Family's medical history. Alcohol, tobacco, and drug use. Emotional well-being. Home life and relationship well-being. Sexual activity. Diet, exercise, and sleep habits. Work and work Statistician. Access to firearms. Method of birth control. Menstrual cycle. Pregnancy history. What immunizations do I need?  Vaccines are usually given at various ages, according to a schedule. Your health care provider will recommend vaccines for you based on your age, medicalhistory, and lifestyle or other factors, such as travel or where you work. What tests do I need? Blood tests Lipid and cholesterol levels. These may be checked every 5 years, or more often if you are over 37 years old. Hepatitis C test. Hepatitis B test. Screening Lung cancer screening. You may have this screening every year starting at age 30 if you have a 30-pack-year history of smoking and currently smoke or have quit within the past 15 years. Colorectal cancer screening. All adults should have this screening starting at  age 23 and continuing until age 3. Your health care provider may recommend screening at age 88 if you are at increased risk. You will have tests every 1-10 years, depending on your results and the type of screening test. Diabetes screening. This is done by checking your blood sugar (glucose) after you have not eaten for a while (fasting). You may have this done every 1-3 years. Mammogram. This may be done every 1-2 years. Talk with your health care provider about when you should start having regular mammograms. This may depend on whether you have a family history of breast cancer. BRCA-related cancer screening. This may be done if you have a family history of breast, ovarian, tubal, or peritoneal cancers. Pelvic exam and Pap test. This may be done every 3 years starting at age 79. Starting at age 54, this may be done every 5 years if you have a Pap test in combination with an HPV test. Other tests STD (sexually transmitted disease) testing, if you are at risk. Bone density scan. This is done to screen for osteoporosis. You may have this scan if you are at high risk for osteoporosis. Talk with your health care provider about your test results, treatment options,and if necessary, the need for more tests. Follow these instructions at home: Eating and drinking  Eat a diet that includes fresh fruits and vegetables, whole grains, lean protein, and low-fat dairy products. Take vitamin and mineral supplements as recommended by your health care provider. Do not drink alcohol if: Your health care provider tells you not to drink. You are pregnant, may be pregnant, or are planning to become pregnant. If you drink alcohol: Limit how much you have to 0-1 drink a day. Be aware  of how much alcohol is in your drink. In the [place] one drink equals one 12 oz bottle of beer (355 mL), one 5 oz glass of wine (148 mL), or one 1 oz glass of hard liquor (44 mL).  Lifestyle Take daily care of your teeth and  gums. Brush your teeth every morning and night with fluoride toothpaste. Floss one time each day. Stay active. Exercise for at least 30 minutes 5 or more days each week. Do not use any products that contain nicotine or tobacco, such as cigarettes, e-cigarettes, and chewing tobacco. If you need help quitting, ask your health care provider. Do not use drugs. If you are sexually active, practice safe sex. Use a condom or other form of protection to prevent STIs (sexually transmitted infections). If you do not wish to become pregnant, use a form of birth control. If you plan to become pregnant, see your health care provider for a prepregnancy visit. If told by your health care provider, take low-dose aspirin daily starting at age 29. Find healthy ways to cope with stress, such as: Meditation, yoga, or listening to music. Journaling. Talking to a trusted person. Spending time with friends and family. Safety Always wear your seat belt while driving or riding in a vehicle. Do not drive: If you have been drinking alcohol. Do not ride with someone who has been drinking. When you are tired or distracted. While texting. Wear a helmet and other protective equipment during sports activities. If you have firearms in your house, make sure you follow all gun safety procedures. What's next? Visit your health care provider once a year for an annual wellness visit. Ask your health care provider how often you should have your eyes and teeth checked. Stay up to date on all vaccines. This information is not intended to replace advice given to you by your health care provider. Make sure you discuss any questions you have with your healthcare provider. Document Revised: 05/31/2020 Document Reviewed: 05/08/2018 Elsevier Patient Education  2022 [employer].

## 2021-03-04 LAB — CBC WITH DIFFERENTIAL/PLATELET
Basophils Absolute: 0.1 10*3/uL (ref 0.0–0.2)
Basos: 1 %
EOS (ABSOLUTE): 0.2 10*3/uL (ref 0.0–0.4)
Eos: 3 %
Hematocrit: 42.8 % (ref 34.0–46.6)
Hemoglobin: 14.4 g/dL (ref 11.1–15.9)
Immature Grans (Abs): 0 10*3/uL (ref 0.0–0.1)
Immature Granulocytes: 0 %
Lymphocytes Absolute: 3.1 10*3/uL (ref 0.7–3.1)
Lymphs: 39 %
MCH: 30.8 pg (ref 26.6–33.0)
MCHC: 33.6 g/dL (ref 31.5–35.7)
MCV: 92 fL (ref 79–97)
Monocytes Absolute: 0.6 10*3/uL (ref 0.1–0.9)
Monocytes: 7 %
Neutrophils Absolute: 4.1 10*3/uL (ref 1.4–7.0)
Neutrophils: 50 %
Platelets: 261 10*3/uL (ref 150–450)
RBC: 4.68 x10E6/uL (ref 3.77–5.28)
RDW: 13.5 % (ref 11.7–15.4)
WBC: 8.1 10*3/uL (ref 3.4–10.8)

## 2021-03-04 LAB — VITAMIN D 25 HYDROXY (VIT D DEFICIENCY, FRACTURES): Vit D, 25-Hydroxy: 38.1 ng/mL (ref 30.0–100.0)

## 2021-03-04 LAB — COMPREHENSIVE METABOLIC PANEL
ALT: 13 IU/L (ref 0–32)
AST: 16 IU/L (ref 0–40)
Albumin/Globulin Ratio: 2.1 (ref 1.2–2.2)
Albumin: 4.7 g/dL (ref 3.8–4.8)
Alkaline Phosphatase: 53 IU/L (ref 44–121)
BUN/Creatinine Ratio: 19 (ref 12–28)
BUN: 15 mg/dL (ref 8–27)
Bilirubin Total: 0.4 mg/dL (ref 0.0–1.2)
CO2: 25 mmol/L (ref 20–29)
Calcium: 9.9 mg/dL (ref 8.7–10.3)
Chloride: 102 mmol/L (ref 96–106)
Creatinine, Ser: 0.81 mg/dL (ref 0.57–1.00)
Globulin, Total: 2.2 g/dL (ref 1.5–4.5)
Glucose: 80 mg/dL (ref 65–99)
Potassium: 4.6 mmol/L (ref 3.5–5.2)
Sodium: 140 mmol/L (ref 134–144)
Total Protein: 6.9 g/dL (ref 6.0–8.5)
eGFR: 82 mL/min/{1.73_m2} (ref >59)

## 2021-03-04 LAB — LIPID PANEL
Chol/HDL Ratio: 3.5 ratio (ref 0.0–4.4)
Cholesterol, Total: 183 mg/dL (ref 100–199)
HDL: 53 mg/dL (ref >39)
LDL Chol Calc (NIH): 112 mg/dL — ABNORMAL HIGH (ref 0–99)
Triglycerides: 97 mg/dL (ref 0–149)
VLDL Cholesterol Cal: 18 mg/dL (ref 5–40)

## 2021-03-04 LAB — TSH: TSH: 1.43 u[IU]/mL (ref 0.450–4.500)

## 2021-03-04 LAB — URINALYSIS, MICROSCOPIC ONLY
Bacteria, UA: NONE SEEN
Casts: NONE SEEN /lpf
RBC, Urine: NONE SEEN /hpf (ref 0–2)
WBC, UA: NONE SEEN /hpf (ref 0–5)

## 2021-03-04 LAB — T4, FREE: Free T4: 1.08 ng/dL (ref 0.82–1.77)

## 2021-03-04 LAB — T3: T3, Total: 121 ng/dL (ref 71–180)

## 2021-03-06 ENCOUNTER — Encounter: Payer: Self-pay | Admitting: Family Medicine

## 2021-03-07 MED ORDER — IBUPROFEN 800 MG PO TABS: 800.0000 mg | ORAL_TABLET | Freq: Three times a day (TID) | ORAL | 0 refills | Status: DC | PRN

## 2021-03-12 ENCOUNTER — Other Ambulatory Visit: Payer: Self-pay | Admitting: Family Medicine

## 2021-04-03 ENCOUNTER — Other Ambulatory Visit: Payer: Self-pay | Admitting: Family Medicine

## 2021-05-09 ENCOUNTER — Telehealth: Payer: Self-pay

## 2021-05-09 ENCOUNTER — Other Ambulatory Visit: Payer: Self-pay

## 2021-05-09 MED ORDER — TRIAMCINOLONE ACETONIDE 0.1 % EX CREA: 1.0000 "application " | TOPICAL_CREAM | Freq: Two times a day (BID) | CUTANEOUS | 0 refills | Status: DC

## 2021-05-09 NOTE — Telephone Encounter (Signed)
Pt informed

## 2021-05-09 NOTE — Telephone Encounter (Signed)
Pt left message needs refill Triamcinolone cream

## 2021-06-10 ENCOUNTER — Other Ambulatory Visit: Payer: Self-pay | Admitting: Family Medicine

## 2021-06-28 ENCOUNTER — Other Ambulatory Visit: Payer: Self-pay | Admitting: Family Medicine

## 2021-06-28 NOTE — Telephone Encounter (Signed)
[facility] to [facility]  please advise [facility]

## 2021-06-29 MED ORDER — IBUPROFEN 800 MG PO TABS: 800.0000 mg | ORAL_TABLET | Freq: Three times a day (TID) | ORAL | 0 refills | Status: DC | PRN

## 2021-07-02 ENCOUNTER — Other Ambulatory Visit: Payer: Self-pay | Admitting: Family Medicine

## 2021-09-07 ENCOUNTER — Telehealth: Payer: Self-pay | Admitting: Family Medicine

## 2021-09-07 MED ORDER — TRIAMCINOLONE ACETONIDE 0.1 % EX CREA
1.0000 | TOPICAL_CREAM | Freq: Two times a day (BID) | CUTANEOUS | 1 refills | Status: DC
Start: 2021-09-07 — End: 2023-05-12

## 2021-09-07 NOTE — Telephone Encounter (Signed)
[facility] triam[facility]one [facility] to [id number] [place], [place] - [address]

## 2021-09-07 NOTE — Telephone Encounter (Signed)
done

## 2021-09-08 ENCOUNTER — Other Ambulatory Visit: Payer: Self-pay | Admitting: Family Medicine

## 2021-10-02 ENCOUNTER — Other Ambulatory Visit: Payer: Self-pay | Admitting: Family Medicine

## 2021-11-07 ENCOUNTER — Other Ambulatory Visit: Payer: Self-pay

## 2021-11-07 DIAGNOSIS — Z1231 Encounter for screening mammogram for malignant neoplasm of breast: Secondary | ICD-10-CM

## 2021-11-08 ENCOUNTER — Ambulatory Visit
Admission: RE | Admit: 2021-11-08 | Discharge: 2021-11-08 | Disposition: A | Discharge status: Home / Self Care | Payer: BC Managed Care – PPO | Source: Ambulatory Visit

## 2021-11-08 DIAGNOSIS — Z1231 Encounter for screening mammogram for malignant neoplasm of breast: Secondary | ICD-10-CM | POA: Diagnosis not present

## 2021-11-26 DIAGNOSIS — R051 Acute cough: Secondary | ICD-10-CM | POA: Diagnosis not present

## 2021-11-28 ENCOUNTER — Other Ambulatory Visit: Payer: Self-pay | Admitting: Family Medicine

## 2021-11-28 MED ORDER — IBUPROFEN 800 MG PO TABS: 800.0000 mg | ORAL_TABLET | Freq: Three times a day (TID) | ORAL | 0 refills | Status: DC | PRN

## 2021-11-28 NOTE — Telephone Encounter (Signed)
[facility] to [facility]. Please advise [facility] 

## 2021-12-06 ENCOUNTER — Other Ambulatory Visit: Payer: Self-pay | Admitting: Family Medicine

## 2021-12-06 ENCOUNTER — Other Ambulatory Visit: Payer: Self-pay | Admitting: Physician Assistant

## 2021-12-06 MED ORDER — ESCITALOPRAM OXALATE 20 MG PO TABS: 20.0000 mg | ORAL_TABLET | Freq: Every day | ORAL | 1 refills | Status: DC

## 2021-12-06 NOTE — Telephone Encounter (Signed)
Is this okay to refill? 

## 2022-01-03 ENCOUNTER — Other Ambulatory Visit: Payer: Self-pay | Admitting: Family Medicine

## 2022-01-04 ENCOUNTER — Other Ambulatory Visit: Payer: Self-pay | Admitting: Family Medicine

## 2022-01-04 MED ORDER — LOVASTATIN 10 MG PO TABS: 10.0000 mg | ORAL_TABLET | Freq: Every day | ORAL | 0 refills | Status: DC

## 2022-03-06 ENCOUNTER — Encounter: Payer: BC Managed Care – PPO | Admitting: Family Medicine

## 2022-03-07 ENCOUNTER — Encounter: Payer: BC Managed Care – PPO | Admitting: Physician Assistant

## 2022-03-09 ENCOUNTER — Encounter: Payer: Self-pay | Admitting: Internal Medicine

## 2022-04-09 ENCOUNTER — Other Ambulatory Visit: Payer: Self-pay | Admitting: Physician Assistant

## 2022-04-27 IMAGING — MG DIGITAL SCREENING BILAT W/ CAD
4 series · 4 of 4 positions shown · non-contrast
Comparison: Previous exam(s).

CLINICAL DATA: Screening.

EXAM:
DIGITAL SCREENING BILATERAL MAMMOGRAM WITH CAD

[R CC]
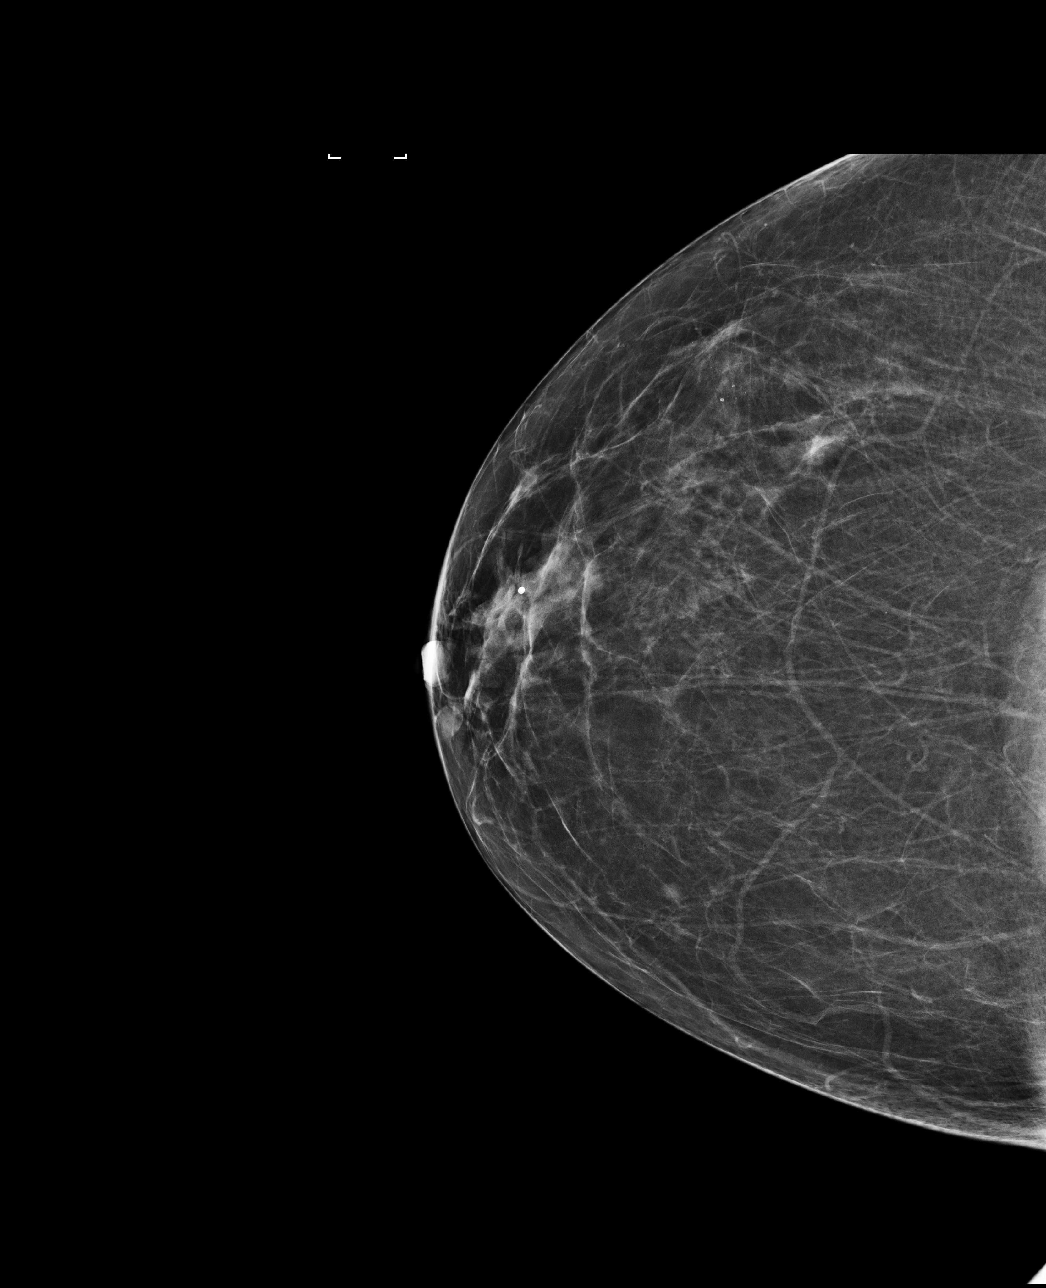

[L CC]
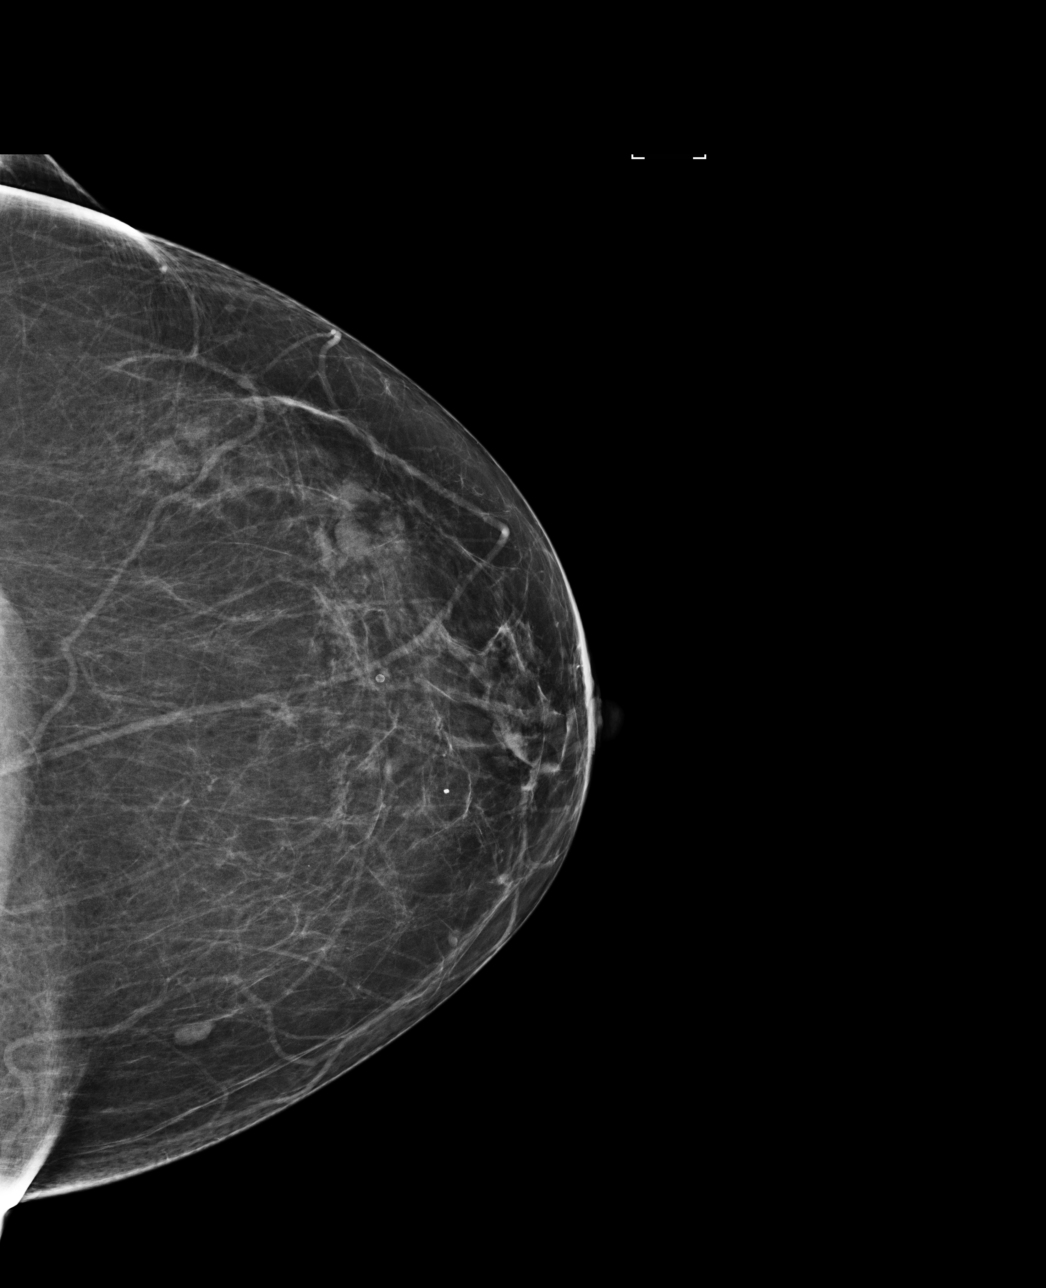

[R MLO]
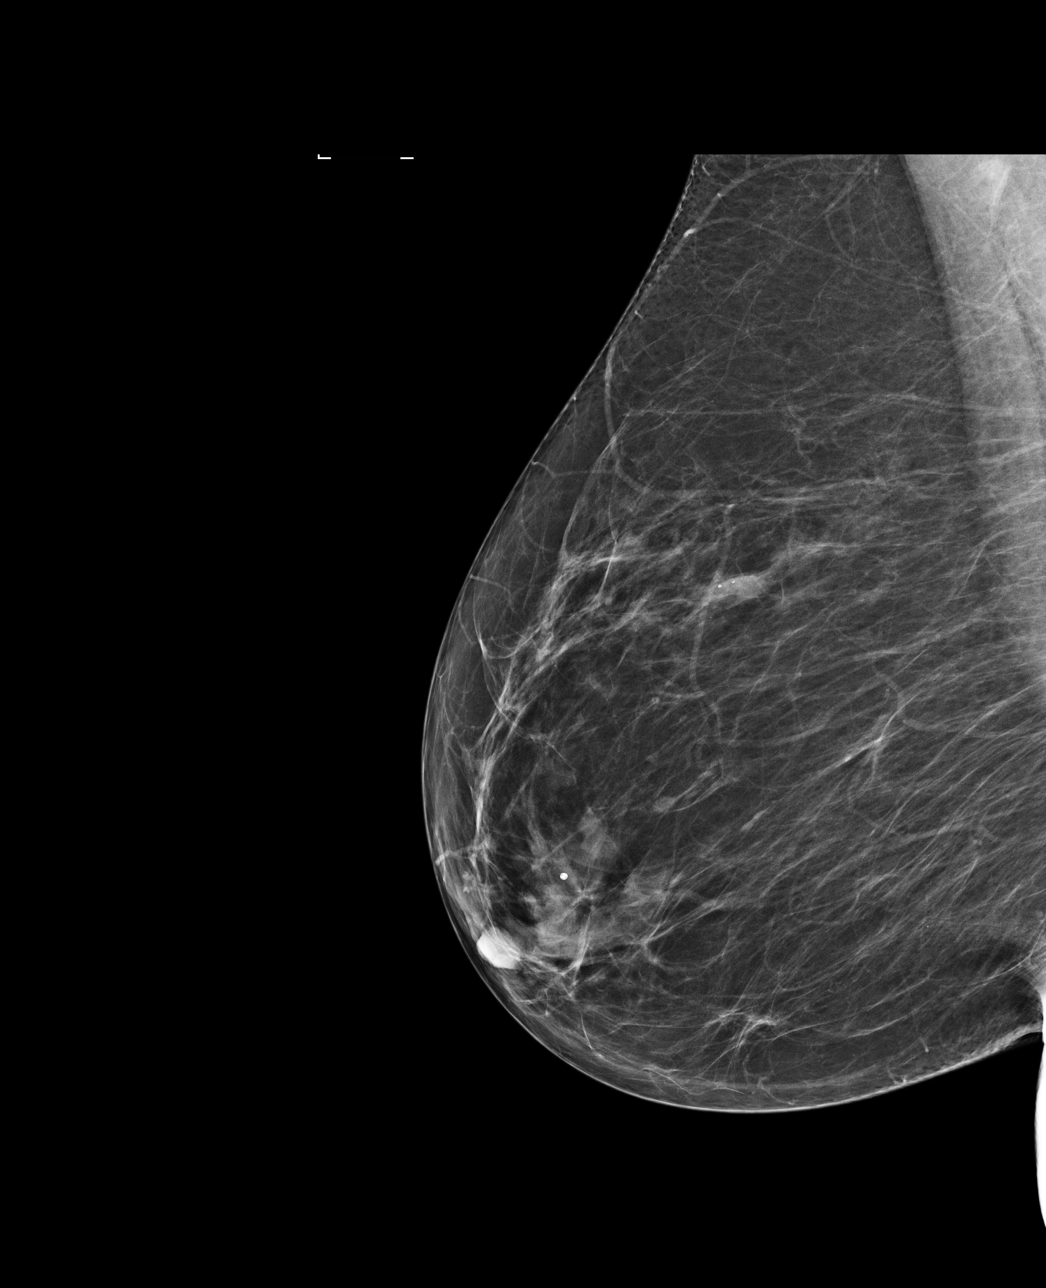

[L MLO]
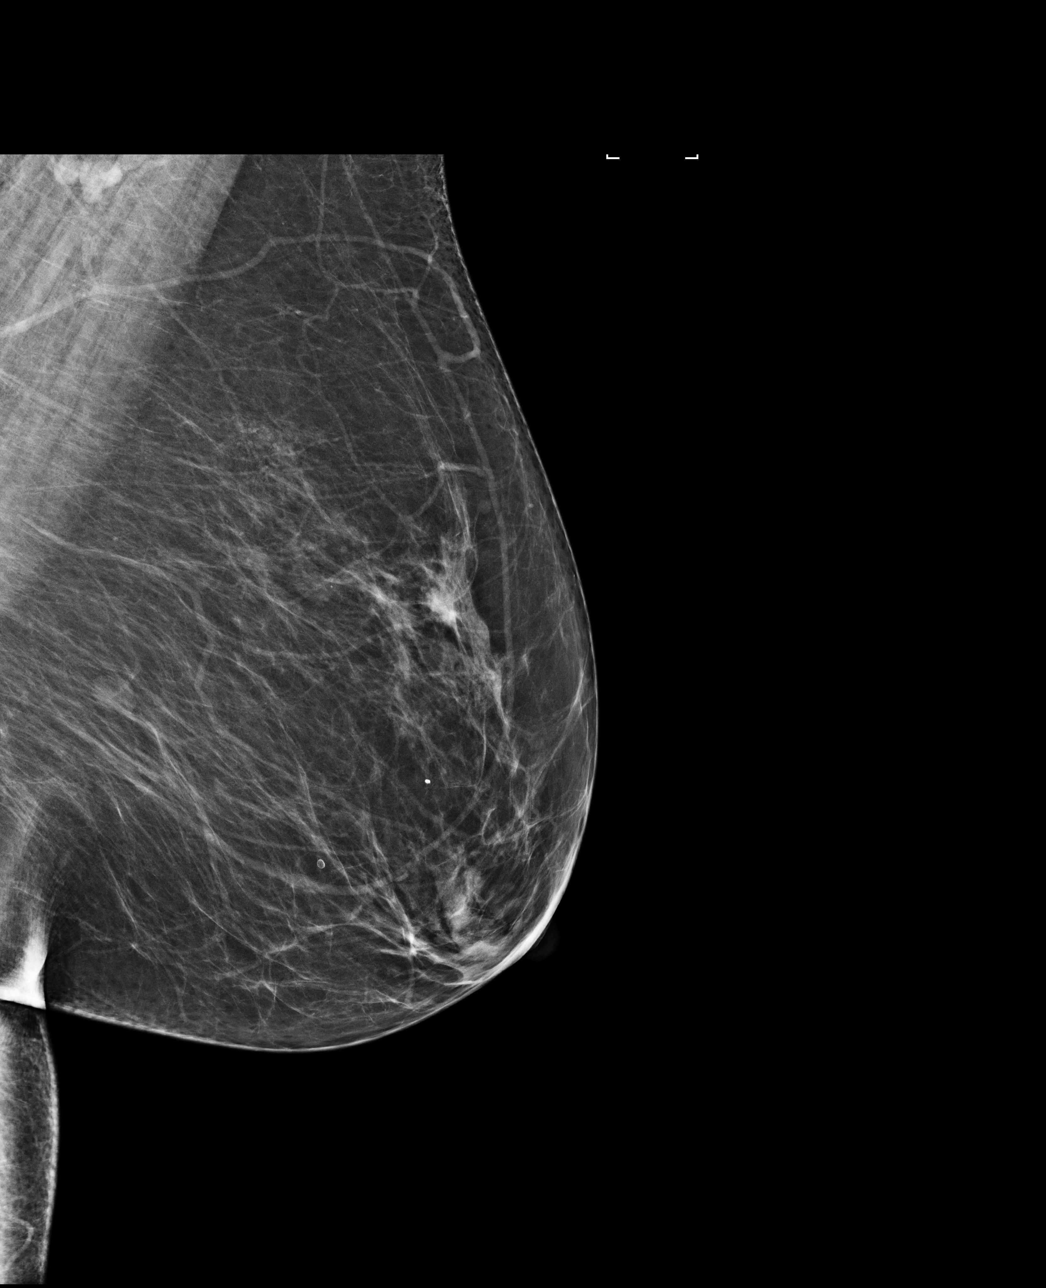

[4 of 4 positions shown; findings below may reference images not displayed]

ACR Breast Density Category b: There are scattered areas of
fibroglandular density.
FINDINGS: There are no findings suspicious for malignancy. Images were
processed with CAD.
IMPRESSION: No mammographic evidence of malignancy. A result letter of this
screening mammogram will be mailed directly to the patient.

RECOMMENDATION:
Screening mammogram in one year. (Code:AS-G-LCT)

BI-RADS CATEGORY  1: Negative.

## 2022-05-16 ENCOUNTER — Encounter: Payer: Self-pay | Admitting: Internal Medicine

## 2022-06-19 ENCOUNTER — Encounter: Payer: Self-pay | Admitting: Internal Medicine

## 2022-07-04 ENCOUNTER — Other Ambulatory Visit: Payer: Self-pay | Admitting: Family Medicine

## 2022-07-24 ENCOUNTER — Encounter: Payer: Self-pay | Admitting: Internal Medicine

## 2022-07-28 ENCOUNTER — Other Ambulatory Visit: Payer: Self-pay | Admitting: Medical

## 2022-12-13 ENCOUNTER — Other Ambulatory Visit: Payer: Self-pay | Admitting: Physician Assistant

## 2023-05-11 ENCOUNTER — Other Ambulatory Visit: Payer: Self-pay

## 2023-05-11 ENCOUNTER — Encounter (HOSPITAL_BASED_OUTPATIENT_CLINIC_OR_DEPARTMENT_OTHER): Payer: Self-pay | Admitting: Emergency Medicine

## 2023-05-11 ENCOUNTER — Emergency Department (HOSPITAL_BASED_OUTPATIENT_CLINIC_OR_DEPARTMENT_OTHER): Payer: 59

## 2023-05-11 ENCOUNTER — Inpatient Hospital Stay (HOSPITAL_BASED_OUTPATIENT_CLINIC_OR_DEPARTMENT_OTHER)
Admission: EM | Admit: 2023-05-11 | Discharge: 2023-05-15 | DRG: 392 | Disposition: A | Discharge status: Home / Self Care | Payer: 59 | Attending: Internal Medicine | Admitting: Internal Medicine

## 2023-05-11 DIAGNOSIS — Z79899 Other long term (current) drug therapy: Secondary | ICD-10-CM

## 2023-05-11 DIAGNOSIS — K5792 Diverticulitis of intestine, part unspecified, without perforation or abscess without bleeding: Secondary | ICD-10-CM | POA: Diagnosis present

## 2023-05-11 DIAGNOSIS — F17210 Nicotine dependence, cigarettes, uncomplicated: Secondary | ICD-10-CM | POA: Diagnosis present

## 2023-05-11 DIAGNOSIS — F419 Anxiety disorder, unspecified: Secondary | ICD-10-CM | POA: Diagnosis present

## 2023-05-11 DIAGNOSIS — Z9049 Acquired absence of other specified parts of digestive tract: Secondary | ICD-10-CM

## 2023-05-11 DIAGNOSIS — Z90710 Acquired absence of both cervix and uterus: Secondary | ICD-10-CM

## 2023-05-11 DIAGNOSIS — K5720 Diverticulitis of large intestine with perforation and abscess without bleeding: Secondary | ICD-10-CM | POA: Diagnosis not present

## 2023-05-11 DIAGNOSIS — M810 Age-related osteoporosis without current pathological fracture: Secondary | ICD-10-CM | POA: Diagnosis present

## 2023-05-11 DIAGNOSIS — K56609 Unspecified intestinal obstruction, unspecified as to partial versus complete obstruction: Secondary | ICD-10-CM | POA: Diagnosis present

## 2023-05-11 DIAGNOSIS — F32A Depression, unspecified: Secondary | ICD-10-CM | POA: Diagnosis present

## 2023-05-11 DIAGNOSIS — K219 Gastro-esophageal reflux disease without esophagitis: Secondary | ICD-10-CM | POA: Diagnosis present

## 2023-05-11 DIAGNOSIS — F411 Generalized anxiety disorder: Secondary | ICD-10-CM | POA: Diagnosis present

## 2023-05-11 DIAGNOSIS — E785 Hyperlipidemia, unspecified: Secondary | ICD-10-CM | POA: Diagnosis present

## 2023-05-11 DIAGNOSIS — Z91040 Latex allergy status: Secondary | ICD-10-CM

## 2023-05-11 DIAGNOSIS — E559 Vitamin D deficiency, unspecified: Secondary | ICD-10-CM | POA: Diagnosis present

## 2023-05-11 DIAGNOSIS — Z880 Allergy status to penicillin: Secondary | ICD-10-CM

## 2023-05-11 DIAGNOSIS — R1084 Generalized abdominal pain: Principal | ICD-10-CM

## 2023-05-11 LAB — URINALYSIS, ROUTINE W REFLEX MICROSCOPIC
Bacteria, UA: NONE SEEN
Bilirubin Urine: NEGATIVE
Glucose, UA: NEGATIVE mg/dL
Ketones, ur: 15 mg/dL — AB
Leukocytes,Ua: NEGATIVE
Nitrite: NEGATIVE
Protein, ur: 30 mg/dL — AB
Specific Gravity, Urine: >1.046 — ABNORMAL HIGH (ref 1.005–1.030)
pH: 7 (ref 5.0–8.0)

## 2023-05-11 LAB — CBC WITH DIFFERENTIAL/PLATELET
Abs Immature Granulocytes: 0.13 10*3/uL — ABNORMAL HIGH (ref 0.00–0.07)
Basophils Absolute: 0.1 10*3/uL (ref 0.0–0.1)
Basophils Relative: 0 %
Eosinophils Absolute: 0 10*3/uL (ref 0.0–0.5)
Eosinophils Relative: 0 %
HCT: 40.6 % (ref 36.0–46.0)
Hemoglobin: 13.9 g/dL (ref 12.0–15.0)
Immature Granulocytes: 1 %
Lymphocytes Relative: 13 %
Lymphs Abs: 2.9 10*3/uL (ref 0.7–4.0)
MCH: 31 pg (ref 26.0–34.0)
MCHC: 34.2 g/dL (ref 30.0–36.0)
MCV: 90.4 fL (ref 80.0–100.0)
Monocytes Absolute: 1.6 10*3/uL — ABNORMAL HIGH (ref 0.1–1.0)
Monocytes Relative: 7 %
Neutro Abs: 17.5 10*3/uL — ABNORMAL HIGH (ref 1.7–7.7)
Neutrophils Relative %: 79 %
Platelets: 241 10*3/uL (ref 150–400)
RBC: 4.49 MIL/uL (ref 3.87–5.11)
RDW: 13.6 % (ref 11.5–15.5)
WBC: 22.2 10*3/uL — ABNORMAL HIGH (ref 4.0–10.5)
nRBC: 0 % (ref 0.0–0.2)

## 2023-05-11 LAB — COMPREHENSIVE METABOLIC PANEL
ALT: 12 U/L (ref 0–44)
AST: 12 U/L — ABNORMAL LOW (ref 15–41)
Albumin: 4.1 g/dL (ref 3.5–5.0)
Alkaline Phosphatase: 60 U/L (ref 38–126)
Anion gap: 11 (ref 5–15)
BUN: 16 mg/dL (ref 8–23)
CO2: 23 mmol/L (ref 22–32)
Calcium: 9.4 mg/dL (ref 8.9–10.3)
Chloride: 99 mmol/L (ref 98–111)
Creatinine, Ser: 0.93 mg/dL (ref 0.44–1.00)
GFR, Estimated: >60 mL/min (ref >60)
Glucose, Bld: 110 mg/dL — ABNORMAL HIGH (ref 70–99)
Potassium: 3.5 mmol/L (ref 3.5–5.1)
Sodium: 133 mmol/L — ABNORMAL LOW (ref 135–145)
Total Bilirubin: 1.3 mg/dL — ABNORMAL HIGH (ref 0.3–1.2)
Total Protein: 7.5 g/dL (ref 6.5–8.1)

## 2023-05-11 LAB — LACTIC ACID, PLASMA: Lactic Acid, Venous: 0.6 mmol/L (ref 0.5–1.9)

## 2023-05-11 LAB — LIPASE, BLOOD: Lipase: 11 U/L (ref 11–51)

## 2023-05-11 MED ORDER — HYDROMORPHONE HCL 1 MG/ML IJ SOLN
0.5000 mg | Freq: Once | INTRAMUSCULAR | Status: AC
  Administered 2023-05-11: 0.5 mg via INTRAVENOUS
  Filled 2023-05-11: qty 1

## 2023-05-11 MED ORDER — IOHEXOL 300 MG/ML  SOLN
100.0000 mL | Freq: Once | INTRAMUSCULAR | Status: AC | PRN
  Administered 2023-05-11: 100 mL via INTRAVENOUS

## 2023-05-11 MED ORDER — HYDROMORPHONE HCL 1 MG/ML IJ SOLN
0.5000 mg | INTRAMUSCULAR | Status: DC | PRN
  Administered 2023-05-11 – 2023-05-12 (×2): 0.5 mg via INTRAVENOUS
  Filled 2023-05-11: qty 1
  Filled 2023-05-11: qty 0.5

## 2023-05-11 MED ORDER — SODIUM CHLORIDE 0.9 % IV SOLN
2.0000 g | Freq: Once | INTRAVENOUS | Status: AC
  Administered 2023-05-11: 2 g via INTRAVENOUS
  Filled 2023-05-11: qty 12.5

## 2023-05-11 MED ORDER — SODIUM CHLORIDE 0.9 % IV BOLUS
1000.0000 mL | Freq: Once | INTRAVENOUS | Status: AC
  Administered 2023-05-11: 1000 mL via INTRAVENOUS

## 2023-05-11 MED ORDER — METRONIDAZOLE 500 MG/100ML IV SOLN
500.0000 mg | Freq: Once | INTRAVENOUS | Status: AC
  Administered 2023-05-12: 500 mg via INTRAVENOUS
  Filled 2023-05-11: qty 100

## 2023-05-11 MED ORDER — ONDANSETRON HCL 4 MG/2ML IJ SOLN
4.0000 mg | Freq: Once | INTRAMUSCULAR | Status: AC
  Administered 2023-05-11: 4 mg via INTRAVENOUS
  Filled 2023-05-11: qty 2

## 2023-05-11 NOTE — ED Provider Notes (Signed)
Kari Rojas EMERGENCY DEPARTMENT AT [facility] Provider Note   CSN: [id number] Arrival date & time: 05/11/23  2151     History  Chief Complaint  Patient presents with   Abdominal Pain    Kari Rojas is a 67 y.o. female.  Patient presents to the emergency department today for evaluation of abdominal pain.  Pain began yesterday morning.  Has been generalized and associated with abdominal distention.  Pain is worse with movement and palpation.  Decreased oral intake and appetite.  Urine has been dark.  She has had a small amount of diarrhea without blood.  No vomiting.  No fevers.  Patient had a previous episode of presumed colitis which felt a bit similar but pain was lower with that.  No history of diverticulitis.  She does have a history of cholecystectomy and appendectomy.  No history of bowel obstruction. Colonoscopy 3 years ago did show diverticulosis.        Home Medications Prior to Admission medications   Medication Sig Start Date End Date Taking? Authorizing Provider  escitalopram (LEXAPRO) 20 MG tablet Take 1 tablet (20 mg total) by mouth daily. 12/06/21   Jake Shark, PA-C  ibuprofen (ADVIL) 800 MG tablet Take 1 tablet (800 mg total) by mouth every 8 (eight) hours as needed. Take with food 11/28/21   Burnard Hawthorne B, PA-C  lovastatin (MEVACOR) 10 MG tablet TAKE 1 TABLET (10 MG TOTAL) BY MOUTH DAILY FOR 90 DOSES 07/30/22 10/28/22  Tysinger, Kermit Balo, PA-C  Multiple Vitamins-Minerals (ONE-A-DAY WOMENS PO) Take by mouth.    [provider]  Saccharomyces boulardii (PROBIOTIC) 250 MG CAPS Take by mouth daily.    [provider]  triamcinolone cream (KENALOG) 0.1 % Apply 1 application topically 2 (two) times daily. 09/07/21   Tysinger, Kermit Balo, PA-C      Allergies    Latex and Penicillins    Review of Systems   Review of Systems  Physical Exam Updated Vital Signs BP 119/73 (BP Location: Right Arm)   Pulse (!) 114   Temp 98.7 F  (37.1 C) (Oral)   Resp 18   Ht 5\' 4"  (1.626 m)   Wt 72.6 kg   SpO2 99%   BMI 27.46 kg/m   Physical Exam Vitals and nursing note reviewed.  Constitutional:      General: She is in acute distress (Uncomfortable).     Appearance: She is well-developed.  HENT:     Head: Normocephalic and atraumatic.     Right Ear: External ear normal.     Left Ear: External ear normal.     Nose: Nose normal.  Eyes:     Conjunctiva/sclera: Conjunctivae normal.  Cardiovascular:     Rate and Rhythm: Normal rate and regular rhythm.     Heart sounds: No murmur heard. Pulmonary:     Effort: No respiratory distress.     Breath sounds: No wheezing, rhonchi or rales.  Abdominal:     Palpations: Abdomen is soft.     Tenderness: There is generalized abdominal tenderness. There is no guarding or rebound.     Comments: Tenderness is generalized  Musculoskeletal:     Cervical back: Normal range of motion and neck supple.     Right lower leg: No edema.     Left lower leg: No edema.  Skin:    General: Skin is warm and dry.     Findings: No rash.  Neurological:     General: No focal deficit present.  Mental Status: She is alert. Mental status is at baseline.     Motor: No weakness.  Psychiatric:        Mood and Affect: Mood normal.     ED Results / Procedures / Treatments   Labs (all labs ordered are listed, but only abnormal results are displayed) Labs Reviewed  CBC WITH DIFFERENTIAL/PLATELET - Abnormal; Notable for the following components:      Result Value   WBC 22.2 (*)    Neutro Abs 17.5 (*)    Monocytes Absolute 1.6 (*)    Abs Immature Granulocytes 0.13 (*)    All other components within normal limits  COMPREHENSIVE METABOLIC PANEL - Abnormal; Notable for the following components:   Sodium 133 (*)    Glucose, Bld 110 (*)    AST 12 (*)    Total Bilirubin 1.3 (*)    All other components within normal limits  LIPASE, BLOOD  LACTIC ACID, PLASMA  URINALYSIS, ROUTINE W REFLEX  MICROSCOPIC    EKG None  Radiology No results found.  Procedures Procedures    Medications Ordered in ED Medications  ceFEPIme (MAXIPIME) 2 g in sodium chloride 0.9 % 100 mL IVPB (has no administration in time range)    And  metroNIDAZOLE (FLAGYL) IVPB 500 mg (has no administration in time range)  sodium chloride 0.9 % bolus 1,000 mL (1,000 mLs Intravenous New Bag/Given 05/11/23 2214)  HYDROmorphone (DILAUDID) injection 0.5 mg (0.5 mg Intravenous Given 05/11/23 2213)  ondansetron (ZOFRAN) injection 4 mg (4 mg Intravenous Given 05/11/23 2213)  iohexol (OMNIPAQUE) 300 MG/ML solution 100 mL (100 mLs Intravenous Contrast Given 05/11/23 2248)    ED Course/ Medical Decision Making/ A&P    Patient seen and examined. History obtained directly from patient and family member.  Reviewed previous colonoscopy.  Labs/EKG: Ordered CBC, CMP, lipase, UA.  Imaging: Ordered CT abdomen pelvis.  Medications/Fluids: Ordered: Dilaudid, Zofran, IV fluid bolus  Most recent vital signs reviewed and are as follows: BP 97/68   Pulse 98   Temp 98.7 F (37.1 C) (Oral)   Resp 20   Ht 5\' 4"  (1.626 m)   Wt 72.6 kg   SpO2 96%   BMI 27.46 kg/m   Initial impression: Abdominal pain with distention  11:07 PM Reassessment performed. Patient appears more comfortable.  Pain improving.  Labs personally reviewed and interpreted including: CBC with elevated white blood cell count at 20,000, CMP unremarkable; lipase normal.  Imaging personally visualized and interpreted including: Suspect possible complicated diverticulitis with microperforation with ileus, awaiting radiology read.  Reviewed pertinent lab work and imaging with patient at bedside. Questions answered.   Most current vital signs reviewed and are as follows: BP 97/68   Pulse 98   Temp 98.7 F (37.1 C) (Oral)   Resp 20   Ht 5\' 4"  (1.626 m)   Wt 72.6 kg   SpO2 96%   BMI 27.46 kg/m   Plan: Will initiate IV abx given concerns on CT and  elevated white blood cell count.  11:21 PM Pending CT results. Dr. Bernette Mayers aware of patient and will follow-up.                                  Medical Decision Making Amount and/or Complexity of Data Reviewed Labs: ordered. Radiology: ordered.  Risk Prescription drug management.   For this patient's complaint of abdominal pain, the following conditions were considered on the differential diagnosis:  gastritis/PUD, enteritis/duodenitis, appendicitis, cholelithiasis/cholecystitis, cholangitis, pancreatitis, ruptured viscus, colitis, diverticulitis, small/large bowel obstruction, proctitis, cystitis, pyelonephritis, ureteral colic, aortic dissection, aortic aneurysm. In women, ectopic pregnancy, pelvic inflammatory disease, ovarian cysts, and tubo-ovarian abscess were also considered. Atypical chest etiologies were also considered including ACS, PE, and pneumonia.            Final Clinical Impression(s) / ED Diagnoses Final diagnoses:  Generalized abdominal pain    Rx / DC Orders ED Discharge Orders     None         Renne Crigler, PA-C 05/11/23 2323    Virgina Norfolk, DO 05/12/23 1831

## 2023-05-11 NOTE — ED Triage Notes (Signed)
Lower abd pain, bloating, loss of appetite, diarrhea x 3 days.  H/o appendectomy and cholecystectomy. Has been treated for colitis in the past as well.   EDP at bedside at time of triage

## 2023-05-12 DIAGNOSIS — F32A Depression, unspecified: Secondary | ICD-10-CM | POA: Diagnosis present

## 2023-05-12 DIAGNOSIS — Z9049 Acquired absence of other specified parts of digestive tract: Secondary | ICD-10-CM | POA: Diagnosis not present

## 2023-05-12 DIAGNOSIS — Z90710 Acquired absence of both cervix and uterus: Secondary | ICD-10-CM | POA: Diagnosis not present

## 2023-05-12 DIAGNOSIS — K5720 Diverticulitis of large intestine with perforation and abscess without bleeding: Secondary | ICD-10-CM | POA: Diagnosis present

## 2023-05-12 DIAGNOSIS — K5792 Diverticulitis of intestine, part unspecified, without perforation or abscess without bleeding: Secondary | ICD-10-CM | POA: Diagnosis present

## 2023-05-12 DIAGNOSIS — M810 Age-related osteoporosis without current pathological fracture: Secondary | ICD-10-CM | POA: Diagnosis present

## 2023-05-12 DIAGNOSIS — E559 Vitamin D deficiency, unspecified: Secondary | ICD-10-CM | POA: Diagnosis present

## 2023-05-12 DIAGNOSIS — K56609 Unspecified intestinal obstruction, unspecified as to partial versus complete obstruction: Secondary | ICD-10-CM | POA: Diagnosis present

## 2023-05-12 DIAGNOSIS — K219 Gastro-esophageal reflux disease without esophagitis: Secondary | ICD-10-CM | POA: Diagnosis present

## 2023-05-12 DIAGNOSIS — F17210 Nicotine dependence, cigarettes, uncomplicated: Secondary | ICD-10-CM | POA: Diagnosis present

## 2023-05-12 DIAGNOSIS — F411 Generalized anxiety disorder: Secondary | ICD-10-CM | POA: Diagnosis present

## 2023-05-12 DIAGNOSIS — Z79899 Other long term (current) drug therapy: Secondary | ICD-10-CM | POA: Diagnosis not present

## 2023-05-12 DIAGNOSIS — E785 Hyperlipidemia, unspecified: Secondary | ICD-10-CM | POA: Diagnosis present

## 2023-05-12 DIAGNOSIS — Z880 Allergy status to penicillin: Secondary | ICD-10-CM | POA: Diagnosis not present

## 2023-05-12 DIAGNOSIS — Z91040 Latex allergy status: Secondary | ICD-10-CM | POA: Diagnosis not present

## 2023-05-12 LAB — CBC
HCT: 35.6 % — ABNORMAL LOW (ref 36.0–46.0)
Hemoglobin: 11.7 g/dL — ABNORMAL LOW (ref 12.0–15.0)
MCH: 30.6 pg (ref 26.0–34.0)
MCHC: 32.9 g/dL (ref 30.0–36.0)
MCV: 93.2 fL (ref 80.0–100.0)
Platelets: 196 10*3/uL (ref 150–400)
RBC: 3.82 MIL/uL — ABNORMAL LOW (ref 3.87–5.11)
RDW: 13.9 % (ref 11.5–15.5)
WBC: 15.9 10*3/uL — ABNORMAL HIGH (ref 4.0–10.5)
nRBC: 0 % (ref 0.0–0.2)

## 2023-05-12 LAB — CREATININE, SERUM
Creatinine, Ser: 0.79 mg/dL (ref 0.44–1.00)
GFR, Estimated: >60 mL/min (ref >60)

## 2023-05-12 LAB — HIV ANTIBODY (ROUTINE TESTING W REFLEX): HIV Screen 4th Generation wRfx: NONREACTIVE

## 2023-05-12 MED ORDER — LACTATED RINGERS IV SOLN: INTRAVENOUS | Status: AC

## 2023-05-12 MED ORDER — MORPHINE SULFATE (PF) 2 MG/ML IV SOLN
2.0000 mg | INTRAVENOUS | Status: DC | PRN
  Administered 2023-05-12 (×2): 2 mg via INTRAVENOUS
  Filled 2023-05-12 (×4): qty 1

## 2023-05-12 MED ORDER — ACETAMINOPHEN 325 MG PO TABS: 650.0000 mg | ORAL_TABLET | Freq: Four times a day (QID) | ORAL | Status: DC | PRN

## 2023-05-12 MED ORDER — ONDANSETRON HCL 4 MG PO TABS
4.0000 mg | ORAL_TABLET | Freq: Four times a day (QID) | ORAL | Status: DC | PRN
  Administered 2023-05-13 – 2023-05-14 (×2): 4 mg via ORAL
  Filled 2023-05-12 (×2): qty 1

## 2023-05-12 MED ORDER — SODIUM CHLORIDE 0.9 % IV SOLN: INTRAVENOUS | Status: DC

## 2023-05-12 MED ORDER — ENOXAPARIN SODIUM 30 MG/0.3ML IJ SOSY: 30.0000 mg | PREFILLED_SYRINGE | INTRAMUSCULAR | Status: DC

## 2023-05-12 MED ORDER — ENOXAPARIN SODIUM 40 MG/0.4ML IJ SOSY
40.0000 mg | PREFILLED_SYRINGE | INTRAMUSCULAR | Status: DC
  Administered 2023-05-12 – 2023-05-15 (×4): 40 mg via SUBCUTANEOUS
  Filled 2023-05-12 (×4): qty 0.4

## 2023-05-12 MED ORDER — METRONIDAZOLE 500 MG/100ML IV SOLN
500.0000 mg | Freq: Two times a day (BID) | INTRAVENOUS | Status: DC
  Administered 2023-05-12 – 2023-05-15 (×6): 500 mg via INTRAVENOUS
  Filled 2023-05-12 (×6): qty 100

## 2023-05-12 MED ORDER — DOCUSATE SODIUM 100 MG PO CAPS
100.0000 mg | ORAL_CAPSULE | Freq: Two times a day (BID) | ORAL | Status: DC
  Administered 2023-05-12 – 2023-05-13 (×3): 100 mg via ORAL
  Filled 2023-05-12 (×5): qty 1

## 2023-05-12 MED ORDER — ACETAMINOPHEN 650 MG RE SUPP: 650.0000 mg | Freq: Four times a day (QID) | RECTAL | Status: DC | PRN

## 2023-05-12 MED ORDER — HYDROMORPHONE HCL 1 MG/ML IJ SOLN: 0.5000 mg | INTRAMUSCULAR | Status: DC | PRN

## 2023-05-12 MED ORDER — MORPHINE SULFATE (PF) 2 MG/ML IV SOLN
2.0000 mg | INTRAVENOUS | Status: DC | PRN
  Administered 2023-05-12 – 2023-05-13 (×2): 2 mg via INTRAVENOUS
  Filled 2023-05-12: qty 1

## 2023-05-12 MED ORDER — ONDANSETRON HCL 4 MG/2ML IJ SOLN: 4.0000 mg | Freq: Four times a day (QID) | INTRAMUSCULAR | Status: DC | PRN

## 2023-05-12 MED ORDER — SODIUM CHLORIDE 0.9 % IV SOLN
2.0000 g | Freq: Two times a day (BID) | INTRAVENOUS | Status: DC
  Administered 2023-05-12 – 2023-05-13 (×2): 2 g via INTRAVENOUS
  Filled 2023-05-12 (×3): qty 12.5

## 2023-05-12 NOTE — ED Notes (Signed)
ED TO INPATIENT HANDOFF REPORT  ED Nurse Name and Phone #: [place] Bindi Klomp [phone]  S Name/Age/Gender Kari Rojas 67 y.o. female Room/Bed: DB010/DB010  Code Status   Code Status: Not on file  Home/SNF/Other Home Patient oriented to: self, place, time, and situation Is this baseline? Yes   Triage Complete: Triage complete  Chief Complaint Acute diverticulitis [K57.92]  Triage Note Lower abd pain, bloating, loss of appetite, diarrhea x 3 days.  H/o appendectomy and cholecystectomy. Has been treated for colitis in the past as well.   EDP at bedside at time of triage   Allergies Allergies  Allergen Reactions   Latex    Penicillins Hives    Level of Care/Admitting Diagnosis ED Disposition     ED Disposition  Admit   Condition  --   Comment  Hospital Area: [facility] [[id number]]  Level of Care: Med-Surg [16]  Interfacility transfer: Yes  May place patient in observation at [facility] or [facility] if equivalent level of care is available:: No  Covid Evaluation: Asymptomatic - no recent exposure (last 10 days) testing not required  Diagnosis: Acute diverticulitis [2025427]  Admitting Physician: Arlean Hopping [[id number]]  Attending Physician: Arlean Hopping [[id number]]          B Medical/Surgery History Past Medical History:  Diagnosis Date   Allergy    Anemia    younger with menses    Depression    GAD (generalized anxiety disorder)    GERD (gastroesophageal reflux disease)    Hyperlipidemia    Osteoporosis    Past Surgical History:  Procedure Laterality Date   APPENDECTOMY     BREAST CYST EXCISION Right    40 + years ago, Benign   CHOLECYSTECTOMY     COLONOSCOPY     10 + yrs ago    HYSTERECTOMY ABDOMINAL WITH SALPINGO-OOPHORECTOMY     has one ovary    TONSILLECTOMY     UPPER GASTROINTESTINAL ENDOSCOPY       A IV Location/Drains/Wounds Patient Lines/Drains/Airways Status     Active  Line/Drains/Airways     Name Placement date Placement time Site Days   Peripheral IV 05/11/23 20 G Left Antecubital 05/11/23  2206  Antecubital  1            Intake/Output Last 24 hours  Intake/Output Summary (Last 24 hours) at 05/12/2023 0126 Last data filed at 05/12/2023 0016 Gross per 24 hour  Intake 1100 ml  Output --  Net 1100 ml    Labs/Imaging Results for orders placed or performed during the hospital encounter of 05/11/23 (from the past 48 hour(s))  CBC with Differential     Status: Abnormal   Collection Time: 05/11/23 10:05 PM  Result Value Ref Range   WBC 22.2 (H) 4.0 - 10.5 K/uL   RBC 4.49 3.87 - 5.11 MIL/uL   Hemoglobin 13.9 12.0 - 15.0 g/dL   HCT 16.0 73.7 - 10.6 %   MCV 90.4 80.0 - 100.0 fL   MCH 31.0 26.0 - 34.0 pg   MCHC 34.2 30.0 - 36.0 g/dL   RDW 26.9 48.5 - 46.2 %   Platelets 241 150 - 400 K/uL   nRBC 0.0 0.0 - 0.2 %   Neutrophils Relative % 79 %   Neutro Abs 17.5 (H) 1.7 - 7.7 K/uL   Lymphocytes Relative 13 %   Lymphs Abs 2.9 0.7 - 4.0 K/uL   Monocytes Relative 7 %   Monocytes Absolute 1.6 (H) 0.1 - 1.0 K/uL  Eosinophils Relative 0 %   Eosinophils Absolute 0.0 0.0 - 0.5 K/uL   Basophils Relative 0 %   Basophils Absolute 0.1 0.0 - 0.1 K/uL   Immature Granulocytes 1 %   Abs Immature Granulocytes 0.13 (H) 0.00 - 0.07 K/uL    Comment: Performed at [facility], [address], [place], [place] [address]  Comprehensive metabolic panel     Status: Abnormal   Collection Time: 05/11/23 10:05 PM  Result Value Ref Range   Sodium 133 (L) 135 - 145 mmol/L   Potassium 3.5 3.5 - 5.1 mmol/L   Chloride 99 98 - 111 mmol/L   CO2 23 22 - 32 mmol/L   Glucose, Bld 110 (H) 70 - 99 mg/dL    Comment: Glucose reference range applies only to samples taken after fasting for at least 8 hours.   BUN 16 8 - 23 mg/dL   Creatinine, Ser 6.21 0.44 - 1.00 mg/dL   Calcium 9.4 8.9 - 30.8 mg/dL   Total Protein 7.5 6.5 - 8.1 g/dL   Albumin 4.1 3.5 - 5.0  g/dL   AST 12 (L) 15 - 41 U/L   ALT 12 0 - 44 U/L   Alkaline Phosphatase 60 38 - 126 U/L   Total Bilirubin 1.3 (H) 0.3 - 1.2 mg/dL   GFR, Estimated >65 >78 mL/min    Comment: (NOTE) Calculated using the CKD-EPI Creatinine Equation (2021)    Anion gap 11 5 - 15    Comment: Performed at [facility], [address], [place], [place] [address]  Lipase, blood     Status: None   Collection Time: 05/11/23 10:05 PM  Result Value Ref Range   Lipase 11 11 - 51 U/L    Comment: Performed at [facility], [address], [place], [place] [address]  Urinalysis, Routine w reflex microscopic -Urine, Clean Catch     Status: Abnormal   Collection Time: 05/11/23 10:05 PM  Result Value Ref Range   Color, Urine YELLOW YELLOW   APPearance CLEAR CLEAR   Specific Gravity, Urine >1.046 (H) 1.005 - 1.030   pH 7.0 5.0 - 8.0   Glucose, UA NEGATIVE NEGATIVE mg/dL   Hgb urine dipstick MODERATE (A) NEGATIVE   Bilirubin Urine NEGATIVE NEGATIVE   Ketones, ur 15 (A) NEGATIVE mg/dL   Protein, ur 30 (A) NEGATIVE mg/dL   Nitrite NEGATIVE NEGATIVE   Leukocytes,Ua NEGATIVE NEGATIVE   RBC / HPF 6-10 0 - 5 RBC/hpf   WBC, UA 0-5 0 - 5 WBC/hpf   Bacteria, UA NONE SEEN NONE SEEN   Squamous Epithelial / HPF 0-5 0 - 5 /HPF    Comment: Performed at [facility], [address] [place], [place] [address]  Lactic acid, plasma     Status: None   Collection Time: 05/11/23 10:09 PM  Result Value Ref Range   Lactic Acid, Venous 0.6 0.5 - 1.9 mmol/L    Comment: Performed at [facility], [address] [place], [place] [address]   CT ABDOMEN PELVIS W CONTRAST  Result Date: 05/11/2023 CLINICAL DATA:  Lower abdominal pain, bloating, loss of appetite, diarrhea. History of colitis EXAM: CT ABDOMEN AND PELVIS WITH CONTRAST TECHNIQUE: Multidetector CT imaging of the abdomen and pelvis was performed using the standard protocol following bolus  administration of intravenous contrast. RADIATION DOSE REDUCTION: This exam was performed according to the departmental dose-optimization program which includes automated exposure control, adjustment of the mA and/or kV according to patient size and/or  use of iterative reconstruction technique. CONTRAST:  OMNIPAQUE IOHEXOL 300 MG/ML  SOLN COMPARISON:  None Available. FINDINGS: Lower chest: No acute abnormality. Hepatobiliary: Unremarkable liver. Cholecystectomy. Prominent bile ducts due to reservoir effect. Pancreas: Unremarkable. Spleen: Unremarkable. Adrenals/Urinary Tract: 1.2 cm enhancing left adrenal nodule (Hounsfield units 122) and 1.0 cm enhancing right adrenal nodule (Hounsfield units 110). No urinary calculi or hydronephrosis.  Unremarkable bladder. Stomach/Bowel: Sigmoid diverticulosis with associated wall thickening and mucosal hyperenhancement. Adjacent pericolonic fat stranding and small volume free fluid. There is free intraperitoneal air anterior to the sigmoid colon compatible with contained perforation (series 2/image 65). No abscess. The small bowel is dilated measuring up to 3.6 cm in diameter. With abrupt transition point in the low pelvis adjacent to the inflamed sigmoid colon (series 2/image 69). The small bowel in this area demonstrates wall thickening and hyperenhancement. Appendectomy.  Small hiatal hernia. Vascular/Lymphatic: Aortic atherosclerosis. No enlarged abdominal or pelvic lymph nodes. Reproductive: Hysterectomy. Other: No abdominal wall hernia. Musculoskeletal: No acute fracture. IMPRESSION: 1. Acute sigmoid diverticulitis with contained perforation. No abscess. 2. Small bowel obstruction with abrupt transition point in the low pelvis adjacent to the inflamed sigmoid colon. The small bowel in this area demonstrates wall thickening and hyperenhancement, which may be reactive to the adjacent perforated diverticulitis. 3. Indeterminate bilateral enhancing adrenal nodules  measuring up to 1.2 cm on the left and 1.0 cm on the right. These likely represent benign adenomas. Comparison with prior imaging is recommended if available. Otherwise 1 year follow-up adrenal washout CT is recommended. Once 1 year of stability has been documented, no further follow-up imaging is recommended. Aortic Atherosclerosis (ICD10-I70.0). Critical Value/emergent results were called by telephone at the time of interpretation on 05/11/2023 at 11:52 pm to provider Dr. Bernette Mayers, who verbally acknowledged these results. Electronically Signed   By: Minerva Fester M.D.   On: 05/11/2023 23:53    Pending Labs Unresulted Labs (From admission, onward)    None       Vitals/Pain Today's Vitals   05/12/23 0030 05/12/23 0049 05/12/23 0051 05/12/23 0115  BP: 102/65   107/60  Pulse: 93 95  91  Resp: 18 20  20   Temp:      TempSrc:      SpO2: 98% 97%  95%  Weight:      Height:      PainSc:   Asleep     Isolation Precautions No active isolations  Medications Medications  HYDROmorphone (DILAUDID) injection 0.5 mg (0.5 mg Intravenous Given 05/11/23 2338)  sodium chloride 0.9 % bolus 1,000 mL (0 mLs Intravenous Stopped 05/12/23 0016)  HYDROmorphone (DILAUDID) injection 0.5 mg (0.5 mg Intravenous Given 05/11/23 2213)  ondansetron (ZOFRAN) injection 4 mg (4 mg Intravenous Given 05/11/23 2213)  iohexol (OMNIPAQUE) 300 MG/ML solution 100 mL (100 mLs Intravenous Contrast Given 05/11/23 2248)  ceFEPIme (MAXIPIME) 2 g in sodium chloride 0.9 % 100 mL IVPB (0 g Intravenous Stopped 05/12/23 0016)    And  metroNIDAZOLE (FLAGYL) IVPB 500 mg (500 mg Intravenous New Bag/Given 05/12/23 0016)  ondansetron (ZOFRAN) injection 4 mg (4 mg Intravenous Given 05/11/23 2338)    Mobility walks     Focused Assessments Abd distended. Last BM 8/31. Passing flatus.    R Recommendations: See Admitting Provider Note  Report given to:   Additional Notes: A&Ox4, ambulatory though has had some episodes of dizziness and  benefits from SBA.

## 2023-05-12 NOTE — H&P (Signed)
History and Physical    Kari Rojas DGU:[id number] DOB: 03-15-1956 DOA: 05/11/2023  PCP: Tollie Eth, NP   Patient coming from: Home  I have personally briefly reviewed patient's old medical records in [facility]  Chief Complaint: Abdominal pain  x 1 days  HPI: Kari Rojas is a 67 y.o. female with PMH significant for depression, GAD, GERD, hyperlipidemia presented in the ED with complaints of  abdominal pain that started yesterday. Patient described abdominal pain as generalized, get worse with palpitation and movement. She denies any nausea and vomiting but reports decreased oral intake and appetite. She reports having similar episodes in the past which got better without surgical intervention.  Patient did have history of cholecystectomy, appendicectomy.  Colonoscopy 3 years back shows diverticulosis.  Patient denies any fever, chills.  ED Course: Patient was tachycardic and hypertensive other vitals were stable. Temp 98.7, HR 114, RR 18, BP 119/73, SpO2 99% on room air. Labs include sodium 133, potassium 3.5, chloride 99, bicarb 23, glucose 110, BUN 16, creatinine 0.93, calcium 9.4, anion gap 11, alkaline phosphatase 60, albumin 4.1, lipase 11, AST 12, ALT 12, total protein 7.5, total bilirubin 1.3, lactic acid 0.6, WBC 22.2, hemoglobin 13.9, hematocrit 40.6, MCV 90.4, platelet 241, UA shows ketones 15, leukocyte esterase negative, nitrates negative, HIV negative, CT abdomen pelvis showed Acute sigmoid diverticulitis with contained perforation. No abscess.Small bowel obstruction with abrupt transition point in the low pelvis adjacent to the inflamed sigmoid colon.    Review of Systems:  Review of Systems  Constitutional:  Positive for chills and malaise/fatigue.  HENT: Negative.    Eyes: Negative.   Respiratory: Negative.    Cardiovascular: Negative.   Gastrointestinal:  Positive for abdominal pain.  Genitourinary: Negative.   Musculoskeletal: Negative.    Skin: Negative.   Neurological: Negative.   Endo/Heme/Allergies: Negative.   Psychiatric/Behavioral:  Positive for depression. The patient is nervous/anxious and has insomnia.     Past Medical History:  Diagnosis Date   Allergy    Anemia    younger with menses    Depression    GAD (generalized anxiety disorder)    GERD (gastroesophageal reflux disease)    Hyperlipidemia    Osteoporosis     Past Surgical History:  Procedure Laterality Date   APPENDECTOMY     BREAST CYST EXCISION Right    40 + years ago, Benign   CHOLECYSTECTOMY     COLONOSCOPY     10 + yrs ago    HYSTERECTOMY ABDOMINAL WITH SALPINGO-OOPHORECTOMY     has one ovary    TONSILLECTOMY     UPPER GASTROINTESTINAL ENDOSCOPY       reports that she has been smoking cigarettes. She has a 7.5 pack-year smoking history. She has never used smokeless tobacco. She reports current alcohol use. She reports that she does not use drugs.  Allergies  Allergen Reactions   Latex    Penicillins Hives    Family History  Adopted: Yes    Family history reviewed and not pertinent .  Prior to Admission medications   Medication Sig Start Date End Date Taking? Authorizing Provider  cyclobenzaprine (FLEXERIL) 5 MG tablet Take 5 mg by mouth at bedtime as needed. 02/28/23   [provider]  escitalopram (LEXAPRO) 10 MG tablet Take 10 mg by mouth daily.    [provider]  escitalopram (LEXAPRO) 20 MG tablet Take 1 tablet (20 mg total) by mouth daily. 12/06/21   Jake Shark, PA-C  ibuprofen (ADVIL) 800 MG tablet Take 1 tablet (800 mg total) by mouth every 8 (eight) hours as needed. Take with food 11/28/21   Burnard Hawthorne B, PA-C  lovastatin (MEVACOR) 10 MG tablet TAKE 1 TABLET (10 MG TOTAL) BY MOUTH DAILY FOR 90 DOSES 07/30/22 10/28/22  Tysinger, Kermit Balo, PA-C  Multiple Vitamins-Minerals (ONE-A-DAY WOMENS PO) Take by mouth.    [provider]  omeprazole (PRILOSEC) 10 MG capsule Take 10 mg by mouth  daily as needed (Indigestion).    [provider]  Saccharomyces boulardii (PROBIOTIC) 250 MG CAPS Take by mouth daily.    [provider]  triamcinolone cream (KENALOG) 0.1 % Apply 1 application topically 2 (two) times daily. 09/07/21   Jac Canavan, PA-C    Physical Exam: Vitals:   05/12/23 0224 05/12/23 0633 05/12/23 0929 05/12/23 1124  BP: 95/64 (!) 109/58 (!) 107/59 108/65  Pulse: 92 83 78 86  Resp: 17 17 18 18   Temp: 98.2 F (36.8 C) 98.3 F (36.8 C) 98.7 F (37.1 C) 98.3 F (36.8 C)  TempSrc: Oral Oral Oral Oral  SpO2: 98% 95% 95% 99%  Weight:      Height:        Constitutional: Appears comfortable, not in any acute distress. Vitals:   05/12/23 0224 05/12/23 0633 05/12/23 0929 05/12/23 1124  BP: 95/64 (!) 109/58 (!) 107/59 108/65  Pulse: 92 83 78 86  Resp: 17 17 18 18   Temp: 98.2 F (36.8 C) 98.3 F (36.8 C) 98.7 F (37.1 C) 98.3 F (36.8 C)  TempSrc: Oral Oral Oral Oral  SpO2: 98% 95% 95% 99%  Weight:      Height:       Eyes: PERRL, lids and conjunctivae normal ENMT: Mucous membranes are moist. Posterior pharynx clear of any exudate or lesions. Neck: normal, supple, no masses, no thyromegaly Respiratory: CTA bilaterally  no wheezing, no crackles. Normal respiratory effort. No accessory muscle use.  Cardiovascular: Regular rate and rhythm, no murmurs / rubs / gallops. No carotid bruits.  Abdomen: Soft, significant tenderness in the lower abdomen , no masses palpated. No hepatosplenomegaly. Bowel sounds positive.  Musculoskeletal: No edema, no clubbing / cyanosis.  Normal muscle tone.  Skin: no rashes, lesions, ulcers. No induration Neurologic: CN 2-12 grossly intact. Sensation intact, DTR normal. Strength 5/5 in all 4.  Psychiatric: Normal judgment and insight. Alert and oriented x 3. Normal mood.    Labs on Admission: I have personally reviewed following labs and imaging studies  CBC: Recent Labs  Lab 05/11/23 2205 05/12/23 0843   WBC 22.2* 15.9*  NEUTROABS 17.5*  --   HGB 13.9 11.7*  HCT 40.6 35.6*  MCV 90.4 93.2  PLT 241 196   Basic Metabolic Panel: Recent Labs  Lab 05/11/23 2205 05/12/23 0843  NA 133*  --   K 3.5  --   CL 99  --   CO2 23  --   GLUCOSE 110*  --   BUN 16  --   CREATININE 0.93 0.79  CALCIUM 9.4  --    GFR: Estimated Creatinine Clearance: 68.5 mL/min (by C-G formula based on SCr of 0.79 mg/dL). Liver Function Tests: Recent Labs  Lab 05/11/23 2205  AST 12*  ALT 12  ALKPHOS 60  BILITOT 1.3*  PROT 7.5  ALBUMIN 4.1   Recent Labs  Lab 05/11/23 2205  LIPASE 11   No results for input(s): "AMMONIA" in the last 168 hours. Coagulation Profile: No results for input(s): "INR", "PROTIME"  in the last 168 hours. Cardiac Enzymes: No results for input(s): "CKTOTAL", "CKMB", "CKMBINDEX", "TROPONINI" in the last 168 hours. BNP (last 3 results) No results for input(s): "PROBNP" in the last 8760 hours. HbA1C: No results for input(s): "HGBA1C" in the last 72 hours. CBG: No results for input(s): "GLUCAP" in the last 168 hours. Lipid Profile: No results for input(s): "CHOL", "HDL", "LDLCALC", "TRIG", "CHOLHDL", "LDLDIRECT" in the last 72 hours. Thyroid Function Tests: No results for input(s): "TSH", "T4TOTAL", "FREET4", "T3FREE", "THYROIDAB" in the last 72 hours. Anemia Panel: No results for input(s): "VITAMINB12", "FOLATE", "FERRITIN", "TIBC", "IRON", "RETICCTPCT" in the last 72 hours. Urine analysis:    Component Value Date/Time   COLORURINE YELLOW 05/11/2023 2205   APPEARANCEUR CLEAR 05/11/2023 2205   LABSPEC >1.046 (H) 05/11/2023 2205   LABSPEC 1.015 03/03/2021 1216   PHURINE 7.0 05/11/2023 2205   GLUCOSEU NEGATIVE 05/11/2023 2205   HGBUR MODERATE (A) 05/11/2023 2205   BILIRUBINUR NEGATIVE 05/11/2023 2205   BILIRUBINUR negative 03/03/2021 1216   KETONESUR 15 (A) 05/11/2023 2205   PROTEINUR 30 (A) 05/11/2023 2205   UROBILINOGEN 0.2 12/29/2020 1015   NITRITE NEGATIVE  05/11/2023 2205   LEUKOCYTESUR NEGATIVE 05/11/2023 2205    Radiological Exams on Admission: CT ABDOMEN PELVIS W CONTRAST  Result Date: 05/11/2023 CLINICAL DATA:  Lower abdominal pain, bloating, loss of appetite, diarrhea. History of colitis EXAM: CT ABDOMEN AND PELVIS WITH CONTRAST TECHNIQUE: Multidetector CT imaging of the abdomen and pelvis was performed using the standard protocol following bolus administration of intravenous contrast. RADIATION DOSE REDUCTION: This exam was performed according to the departmental dose-optimization program which includes automated exposure control, adjustment of the mA and/or kV according to patient size and/or use of iterative reconstruction technique. CONTRAST:  OMNIPAQUE IOHEXOL 300 MG/ML  SOLN COMPARISON:  None Available. FINDINGS: Lower chest: No acute abnormality. Hepatobiliary: Unremarkable liver. Cholecystectomy. Prominent bile ducts due to reservoir effect. Pancreas: Unremarkable. Spleen: Unremarkable. Adrenals/Urinary Tract: 1.2 cm enhancing left adrenal nodule (Hounsfield units 122) and 1.0 cm enhancing right adrenal nodule (Hounsfield units 110). No urinary calculi or hydronephrosis.  Unremarkable bladder. Stomach/Bowel: Sigmoid diverticulosis with associated wall thickening and mucosal hyperenhancement. Adjacent pericolonic fat stranding and small volume free fluid. There is free intraperitoneal air anterior to the sigmoid colon compatible with contained perforation (series 2/image 65). No abscess. The small bowel is dilated measuring up to 3.6 cm in diameter. With abrupt transition point in the low pelvis adjacent to the inflamed sigmoid colon (series 2/image 69). The small bowel in this area demonstrates wall thickening and hyperenhancement. Appendectomy.  Small hiatal hernia. Vascular/Lymphatic: Aortic atherosclerosis. No enlarged abdominal or pelvic lymph nodes. Reproductive: Hysterectomy. Other: No abdominal wall hernia. Musculoskeletal: No acute  fracture. IMPRESSION: 1. Acute sigmoid diverticulitis with contained perforation. No abscess. 2. Small bowel obstruction with abrupt transition point in the low pelvis adjacent to the inflamed sigmoid colon. The small bowel in this area demonstrates wall thickening and hyperenhancement, which may be reactive to the adjacent perforated diverticulitis. 3. Indeterminate bilateral enhancing adrenal nodules measuring up to 1.2 cm on the left and 1.0 cm on the right. These likely represent benign adenomas. Comparison with prior imaging is recommended if available. Otherwise 1 year follow-up adrenal washout CT is recommended. Once 1 year of stability has been documented, no further follow-up imaging is recommended. Aortic Atherosclerosis (ICD10-I70.0). Critical Value/emergent results were called by telephone at the time of interpretation on 05/11/2023 at 11:52 pm to provider Dr. Bernette Mayers, who verbally acknowledged these results. Electronically Signed  By: Minerva Fester M.D.   On: 05/11/2023 23:53    EKG: Independently reviewed.   Assessment/Plan Principal Problem:   Acute diverticulitis Active Problems:   Anxiety   Hyperlipidemia   Gastroesophageal reflux disease   Vitamin D deficiency   Perforation of sigmoid colon due to diverticulitis  Acute diverticulitis Patient presented with abdominal pain associated with decreased appetite, CT abdomen and pelvis shows acute sigmoid diverticulitis with contained perforation, no abscess. Continue bowel rest, IV hydration. Continue IV fluid resuscitation. Continue empiric antibiotics cefepime and Flagyl. General surgery is consulted.  Patient is able to pass flatus. Patient started on clear liquid diet, if she could not tolerate, consider NG tube compression.  Possible small bowel obstruction: CT abdomen and pelvis showed SBO with transition point in the lower pelvis adjacent to the inflamed sigmoid colon.  Patient started on clear liquid diet for  now, Continue IV antibiotics. If she developed nausea and vomiting consider NG tube.  No surgical intervention needed at this time.  Generalized anxiety disorder/depression: Hold antianxiety antidepressant medications for now.     DVT prophylaxis:Lovenox Code Status: Full code Family Communication: Daughter at bed side. Disposition Plan:     Status is: Inpatient Remains inpatient appropriate because:  Admitted for sigmoiid diverticulitis with contained perforation.   Consults called:  General surgery Admission status: inpatient   Willeen Niece MD [facility]   If 7PM-7AM, please contact night-coverage  05/12/2023, 3:09 PM

## 2023-05-12 NOTE — Progress Notes (Signed)
Admission was paged at [phone], to make them aware of pt's arrival to unit. Nurse was informed that Dr. Imogene Burn will be seeing this pt.

## 2023-05-12 NOTE — ED Provider Notes (Addendum)
Care of the patient assumed at change of shift pending CT results and likely admission.   Clinical Course as of 05/12/23 0155  Wynelle Link May 12, 2023  0008 Spoke with radiologist, patient has diverticulitis with contained perforation and adjacent SBO. Abx ordered by previous provider. Pain/nausea meds ordered for comfort. Will discuss with General Surgery and anticipate admission for further management.  [CS]  8756 Spoke with Dr. Bedelia Person, Surgery, who recommends medical admission for Abx, they will consult. Patient and daughter are amenable to this plan. Pain currently well controlled.  [CS]  [phone] Spoke with Dr. Julian Reil, Hospitalist, who will accept for admission.  [CS]  0154 Patient's daughter is a Charity fundraiser at [employer], scheduled to work later this morning and has taken her there by POV when a bed was assigned.  [CS]    Clinical Course User Index [CS] Pollyann Savoy, MD   Medical Decision Making Problems Addressed: Diverticulitis of large intestine with perforation without bleeding: acute illness or injury Small bowel obstruction [facility]): acute illness or injury  Amount and/or Complexity of Data Reviewed Labs: ordered. Decision-making details documented in ED Course. Radiology: ordered and independent interpretation performed. Decision-making details documented in ED Course.  Risk Prescription drug management. Parenteral controlled substances. Decision regarding hospitalization.     Pollyann Savoy, MD 05/12/23 [phone]

## 2023-05-12 NOTE — Progress Notes (Signed)
Pt has arrived to unit via wheelchair by daughter.

## 2023-05-12 NOTE — Consult Note (Signed)
Reason for Consult: Acute diverticulitis Referring Physician: Idelle Leech MD  Kari Rojas is an 67 y.o. female.  HPI: Pleasant 67 year old female with a 2-day history of left lower quadrant abdominal pain.  Previous history of colitis 3 years ago she states.  Pain started Friday morning and progressed throughout the day.  She was seen yesterday [facility] where CT scan showed acute diverticulitis with probable microperforation.  She also had some small BOWEL dilation concerning for possible small bowel obstruction.  She has had no significant vomiting.  She has had some intermittent nausea.  Her bowels have been moving.  Past Medical History:  Diagnosis Date   Allergy    Anemia    younger with menses    Depression    GAD (generalized anxiety disorder)    GERD (gastroesophageal reflux disease)    Hyperlipidemia    Osteoporosis     Past Surgical History:  Procedure Laterality Date   APPENDECTOMY     BREAST CYST EXCISION Right    40 + years ago, Benign   CHOLECYSTECTOMY     COLONOSCOPY     10 + yrs ago    HYSTERECTOMY ABDOMINAL WITH SALPINGO-OOPHORECTOMY     has one ovary    TONSILLECTOMY     UPPER GASTROINTESTINAL ENDOSCOPY      Family History  Adopted: Yes    Social History:  reports that she has been smoking cigarettes. She has a 7.5 pack-year smoking history. She has never used smokeless tobacco. She reports current alcohol use. She reports that she does not use drugs.  Allergies:  Allergies  Allergen Reactions   Latex    Penicillins Hives    Medications: I have reviewed the patient's current medications.  Results for orders placed or performed during the hospital encounter of 05/11/23 (from the past 48 hour(s))  CBC with Differential     Status: Abnormal   Collection Time: 05/11/23 10:05 PM  Result Value Ref Range   WBC 22.2 (H) 4.0 - 10.5 K/uL   RBC 4.49 3.87 - 5.11 MIL/uL   Hemoglobin 13.9 12.0 - 15.0 g/dL   HCT 19.1 47.8 - 29.5 %   MCV 90.4 80.0 - 100.0  fL   MCH 31.0 26.0 - 34.0 pg   MCHC 34.2 30.0 - 36.0 g/dL   RDW 62.1 30.8 - 65.7 %   Platelets 241 150 - 400 K/uL   nRBC 0.0 0.0 - 0.2 %   Neutrophils Relative % 79 %   Neutro Abs 17.5 (H) 1.7 - 7.7 K/uL   Lymphocytes Relative 13 %   Lymphs Abs 2.9 0.7 - 4.0 K/uL   Monocytes Relative 7 %   Monocytes Absolute 1.6 (H) 0.1 - 1.0 K/uL   Eosinophils Relative 0 %   Eosinophils Absolute 0.0 0.0 - 0.5 K/uL   Basophils Relative 0 %   Basophils Absolute 0.1 0.0 - 0.1 K/uL   Immature Granulocytes 1 %   Abs Immature Granulocytes 0.13 (H) 0.00 - 0.07 K/uL    Comment: Performed at [facility], [address] [place], [place] [address]  Comprehensive metabolic panel     Status: Abnormal   Collection Time: 05/11/23 10:05 PM  Result Value Ref Range   Sodium 133 (L) 135 - 145 mmol/L   Potassium 3.5 3.5 - 5.1 mmol/L   Chloride 99 98 - 111 mmol/L   CO2 23 22 - 32 mmol/L   Glucose, Bld 110 (H) 70 - 99 mg/dL    Comment: Glucose reference range applies  only to samples taken after fasting for at least 8 hours.   BUN 16 8 - 23 mg/dL   Creatinine, Ser 7.56 0.44 - 1.00 mg/dL   Calcium 9.4 8.9 - 43.3 mg/dL   Total Protein 7.5 6.5 - 8.1 g/dL   Albumin 4.1 3.5 - 5.0 g/dL   AST 12 (L) 15 - 41 U/L   ALT 12 0 - 44 U/L   Alkaline Phosphatase 60 38 - 126 U/L   Total Bilirubin 1.3 (H) 0.3 - 1.2 mg/dL   GFR, Estimated >29 >51 mL/min    Comment: (NOTE) Calculated using the CKD-EPI Creatinine Equation (2021)    Anion gap 11 5 - 15    Comment: Performed at [facility], [address], [place], [place] [address]  Lipase, blood     Status: None   Collection Time: 05/11/23 10:05 PM  Result Value Ref Range   Lipase 11 11 - 51 U/L    Comment: Performed at [facility], [address] [place], [place] [address]  Urinalysis, Routine w reflex microscopic -Urine, Clean Catch     Status: Abnormal   Collection Time: 05/11/23 10:05 PM  Result Value Ref  Range   Color, Urine YELLOW YELLOW   APPearance CLEAR CLEAR   Specific Gravity, Urine >1.046 (H) 1.005 - 1.030   pH 7.0 5.0 - 8.0   Glucose, UA NEGATIVE NEGATIVE mg/dL   Hgb urine dipstick MODERATE (A) NEGATIVE   Bilirubin Urine NEGATIVE NEGATIVE   Ketones, ur 15 (A) NEGATIVE mg/dL   Protein, ur 30 (A) NEGATIVE mg/dL   Nitrite NEGATIVE NEGATIVE   Leukocytes,Ua NEGATIVE NEGATIVE   RBC / HPF 6-10 0 - 5 RBC/hpf   WBC, UA 0-5 0 - 5 WBC/hpf   Bacteria, UA NONE SEEN NONE SEEN   Squamous Epithelial / HPF 0-5 0 - 5 /HPF    Comment: Performed at [facility], [address], [place], [place] [address]  Lactic acid, plasma     Status: None   Collection Time: 05/11/23 10:09 PM  Result Value Ref Range   Lactic Acid, Venous 0.6 0.5 - 1.9 mmol/L    Comment: Performed at [facility], [address] [place], [place] [address]    CT ABDOMEN PELVIS W CONTRAST  Result Date: 05/11/2023 CLINICAL DATA:  Lower abdominal pain, bloating, loss of appetite, diarrhea. History of colitis EXAM: CT ABDOMEN AND PELVIS WITH CONTRAST TECHNIQUE: Multidetector CT imaging of the abdomen and pelvis was performed using the standard protocol following bolus administration of intravenous contrast. RADIATION DOSE REDUCTION: This exam was performed according to the departmental dose-optimization program which includes automated exposure control, adjustment of the mA and/or kV according to patient size and/or use of iterative reconstruction technique. CONTRAST:  OMNIPAQUE IOHEXOL 300 MG/ML  SOLN COMPARISON:  None Available. FINDINGS: Lower chest: No acute abnormality. Hepatobiliary: Unremarkable liver. Cholecystectomy. Prominent bile ducts due to reservoir effect. Pancreas: Unremarkable. Spleen: Unremarkable. Adrenals/Urinary Tract: 1.2 cm enhancing left adrenal nodule (Hounsfield units 122) and 1.0 cm enhancing right adrenal nodule (Hounsfield units 110). No urinary calculi or  hydronephrosis.  Unremarkable bladder. Stomach/Bowel: Sigmoid diverticulosis with associated wall thickening and mucosal hyperenhancement. Adjacent pericolonic fat stranding and small volume free fluid. There is free intraperitoneal air anterior to the sigmoid colon compatible with contained perforation (series 2/image 65). No abscess. The small bowel is dilated measuring up to 3.6 cm in diameter. With abrupt transition point in the low pelvis adjacent to the inflamed sigmoid colon (series  2/image 69). The small bowel in this area demonstrates wall thickening and hyperenhancement. Appendectomy.  Small hiatal hernia. Vascular/Lymphatic: Aortic atherosclerosis. No enlarged abdominal or pelvic lymph nodes. Reproductive: Hysterectomy. Other: No abdominal wall hernia. Musculoskeletal: No acute fracture. IMPRESSION: 1. Acute sigmoid diverticulitis with contained perforation. No abscess. 2. Small bowel obstruction with abrupt transition point in the low pelvis adjacent to the inflamed sigmoid colon. The small bowel in this area demonstrates wall thickening and hyperenhancement, which may be reactive to the adjacent perforated diverticulitis. 3. Indeterminate bilateral enhancing adrenal nodules measuring up to 1.2 cm on the left and 1.0 cm on the right. These likely represent benign adenomas. Comparison with prior imaging is recommended if available. Otherwise 1 year follow-up adrenal washout CT is recommended. Once 1 year of stability has been documented, no further follow-up imaging is recommended. Aortic Atherosclerosis (ICD10-I70.0). Critical Value/emergent results were called by telephone at the time of interpretation on 05/11/2023 at 11:52 pm to provider Dr. Bernette Mayers, who verbally acknowledged these results. Electronically Signed   By: Minerva Fester M.D.   On: 05/11/2023 23:53    Review of Systems  Gastrointestinal:  Positive for abdominal pain and nausea.  All other systems reviewed and are negative.  Blood  pressure (!) 109/58, pulse 83, temperature 98.3 F (36.8 C), temperature source Oral, resp. rate 17, height 5\' 4"  (1.626 m), weight 72.6 kg, SpO2 95%. Physical Exam HENT:     Head: Normocephalic.  Cardiovascular:     Rate and Rhythm: Normal rate.  Pulmonary:     Effort: Pulmonary effort is normal.  Abdominal:     Palpations: Abdomen is soft.     Tenderness: There is abdominal tenderness in the left lower quadrant. There is rebound. There is no guarding.  Skin:    General: Skin is warm.  Neurological:     Mental Status: She is alert.     Assessment/Plan: Acute diverticulitis with microperforation, possible evolving small bowel obstruction  She looks well clinically.  She is not distended.  She does not have an acute abdomen.  Recommend clear liquids for now continue antibiotic therapy.  If she develops nausea vomiting, NG tube would be helpful.  No acute surgical need at this point.  Will continue to follow.  Kari Bambrick A Lamara Brecht  MD 05/12/2023, 8:21 AM    HIGH COMPLEXITY

## 2023-05-12 NOTE — ED Notes (Addendum)
IV secured for POV transport to [facility]. Pt V/S stable. NAD, breathing unlabored and regular. 95% on RA. Ambulatory. EDP aware of patient desire to go POV to admission room and agrees with this plan.

## 2023-05-12 NOTE — Progress Notes (Addendum)
Pharmacy Antibiotic Note  Kari Rojas is a 67 y.o. female admitted on 05/11/2023 with acute diverticulitis .  Pharmacy has been consulted for Cefepime dosing.  Plan: - Cefepime 2gm IV every 12 hours - Metronidazole 500mg  IV every 12 hours per MD - Follow up renal function, culture results, and clinical course.   Height: 5\' 4"  (162.6 cm) Weight: 72.6 kg (160 lb) IBW/kg (Calculated) : 54.7  Temp (24hrs), Avg:98.5 F (36.9 C), Min:98.2 F (36.8 C), Max:98.7 F (37.1 C)  Recent Labs  Lab 05/11/23 2205 05/11/23 2209  WBC 22.2*  --   CREATININE 0.93  --   LATICACIDVEN  --  0.6    Estimated Creatinine Clearance: 58.9 mL/min (by C-G formula based on SCr of 0.93 mg/dL).    Allergies  Allergen Reactions   Latex    Penicillins Hives    Antimicrobials this admission: 8/31 Cefepime >>  8/31 Metronidazole >>  Microbiology results: none  Thank you for allowing pharmacy to be a part of this patient's care.  Sanjna Haskew Tylene Fantasia 05/12/2023 8:41 AM

## 2023-05-13 ENCOUNTER — Inpatient Hospital Stay (HOSPITAL_COMMUNITY): Payer: 59

## 2023-05-13 DIAGNOSIS — K5792 Diverticulitis of intestine, part unspecified, without perforation or abscess without bleeding: Secondary | ICD-10-CM | POA: Diagnosis not present

## 2023-05-13 LAB — CBC
HCT: 39.9 % (ref 36.0–46.0)
Hemoglobin: 12.9 g/dL (ref 12.0–15.0)
MCH: 31.2 pg (ref 26.0–34.0)
MCHC: 32.3 g/dL (ref 30.0–36.0)
MCV: 96.6 fL (ref 80.0–100.0)
Platelets: 229 10*3/uL (ref 150–400)
RBC: 4.13 MIL/uL (ref 3.87–5.11)
RDW: 13.9 % (ref 11.5–15.5)
WBC: 12.5 10*3/uL — ABNORMAL HIGH (ref 4.0–10.5)
nRBC: 0 % (ref 0.0–0.2)

## 2023-05-13 LAB — COMPREHENSIVE METABOLIC PANEL
ALT: 13 U/L (ref 0–44)
AST: 11 U/L — ABNORMAL LOW (ref 15–41)
Albumin: 3.1 g/dL — ABNORMAL LOW (ref 3.5–5.0)
Alkaline Phosphatase: 52 U/L (ref 38–126)
Anion gap: 8 (ref 5–15)
BUN: 18 mg/dL (ref 8–23)
CO2: 22 mmol/L (ref 22–32)
Calcium: 8.4 mg/dL — ABNORMAL LOW (ref 8.9–10.3)
Chloride: 108 mmol/L (ref 98–111)
Creatinine, Ser: 0.85 mg/dL (ref 0.44–1.00)
GFR, Estimated: >60 mL/min (ref >60)
Glucose, Bld: 78 mg/dL (ref 70–99)
Potassium: 3.7 mmol/L (ref 3.5–5.1)
Sodium: 138 mmol/L (ref 135–145)
Total Bilirubin: 0.7 mg/dL (ref 0.3–1.2)
Total Protein: 6.1 g/dL — ABNORMAL LOW (ref 6.5–8.1)

## 2023-05-13 LAB — MAGNESIUM: Magnesium: 1.9 mg/dL (ref 1.7–2.4)

## 2023-05-13 LAB — PHOSPHORUS: Phosphorus: 2.6 mg/dL (ref 2.5–4.6)

## 2023-05-13 MED ORDER — OXYCODONE HCL 5 MG PO TABS
5.0000 mg | ORAL_TABLET | Freq: Four times a day (QID) | ORAL | Status: DC | PRN
  Administered 2023-05-13 – 2023-05-14 (×2): 5 mg via ORAL
  Filled 2023-05-13 (×2): qty 1

## 2023-05-13 MED ORDER — SODIUM CHLORIDE 0.9 % IV SOLN
2.0000 g | Freq: Three times a day (TID) | INTRAVENOUS | Status: DC
  Administered 2023-05-13 – 2023-05-15 (×6): 2 g via INTRAVENOUS
  Filled 2023-05-13 (×7): qty 12.5

## 2023-05-13 MED ORDER — ALUM & MAG HYDROXIDE-SIMETH 200-200-20 MG/5ML PO SUSP
30.0000 mL | ORAL | Status: DC | PRN
  Administered 2023-05-13 – 2023-05-14 (×2): 30 mL via ORAL
  Filled 2023-05-13 (×3): qty 30

## 2023-05-13 MED ORDER — ZOLPIDEM TARTRATE 5 MG PO TABS
5.0000 mg | ORAL_TABLET | Freq: Every evening | ORAL | Status: DC | PRN
  Administered 2023-05-13 – 2023-05-14 (×2): 5 mg via ORAL
  Filled 2023-05-13 (×2): qty 1

## 2023-05-13 NOTE — Plan of Care (Signed)

## 2023-05-13 NOTE — Progress Notes (Signed)
Mobility Specialist - Progress Note   05/13/23 0845  Mobility  Activity Ambulated independently in hallway  Level of Assistance Independent  Assistive Device None  Distance Ambulated (ft) 500 ft  Activity Response Tolerated well  Mobility Referral Yes  $Mobility charge 1 Mobility  Mobility Specialist Start Time (ACUTE ONLY) 0835  Mobility Specialist Stop Time (ACUTE ONLY) 0844  Mobility Specialist Time Calculation (min) (ACUTE ONLY) 9 min   Pt received in bed and agreeable to mobility. No complaints during session. Pt to recliner after session with all needs met.   [facility]

## 2023-05-13 NOTE — Progress Notes (Signed)
Subjective/Chief Complaint: Pt with less pain One episode of vomiting  No bm or flatus    Objective: Vital signs in last 24 hours: Temp:  [97.8 F (36.6 C)-98.7 F (37.1 C)] 97.8 F (36.6 C) (09/02 0450) Pulse Rate:  [78-86] 80 (09/02 0450) Resp:  [16-18] 16 (09/02 0450) BP: (104-121)/(56-67) 121/56 (09/02 0450) SpO2:  [95 %-99 %] 96 % (09/02 0450) Last BM Date : 05/11/23  Intake/Output from previous day: 09/01 0701 - 09/02 0700 In: 2313 [P.O.:180; I.V.:1833; IV Piggyback:300] Out: 400 [Urine:400] Intake/Output this shift: No intake/output data recorded.  Abd: soft less tender LLQ  Obese mild distention no rebound or guarding   Lab Results:  Recent Labs    05/12/23 0843 05/13/23 0457  WBC 15.9* 12.5*  HGB 11.7* 12.9  HCT 35.6* 39.9  PLT 196 229   BMET Recent Labs    05/11/23 2205 05/12/23 0843 05/13/23 0706  NA 133*  --  138  K 3.5  --  3.7  CL 99  --  108  CO2 23  --  22  GLUCOSE 110*  --  78  BUN 16  --  18  CREATININE 0.93 0.79 0.85  CALCIUM 9.4  --  8.4*   PT/INR No results for input(s): "LABPROT", "INR" in the last 72 hours. ABG No results for input(s): "PHART", "HCO3" in the last 72 hours.  Invalid input(s): "PCO2", "PO2"  Studies/Results: CT ABDOMEN PELVIS W CONTRAST  Result Date: 05/11/2023 CLINICAL DATA:  Lower abdominal pain, bloating, loss of appetite, diarrhea. History of colitis EXAM: CT ABDOMEN AND PELVIS WITH CONTRAST TECHNIQUE: Multidetector CT imaging of the abdomen and pelvis was performed using the standard protocol following bolus administration of intravenous contrast. RADIATION DOSE REDUCTION: This exam was performed according to the departmental dose-optimization program which includes automated exposure control, adjustment of the mA and/or kV according to patient size and/or use of iterative reconstruction technique. CONTRAST:  OMNIPAQUE IOHEXOL 300 MG/ML  SOLN COMPARISON:  None Available. FINDINGS: Lower chest: No  acute abnormality. Hepatobiliary: Unremarkable liver. Cholecystectomy. Prominent bile ducts due to reservoir effect. Pancreas: Unremarkable. Spleen: Unremarkable. Adrenals/Urinary Tract: 1.2 cm enhancing left adrenal nodule (Hounsfield units 122) and 1.0 cm enhancing right adrenal nodule (Hounsfield units 110). No urinary calculi or hydronephrosis.  Unremarkable bladder. Stomach/Bowel: Sigmoid diverticulosis with associated wall thickening and mucosal hyperenhancement. Adjacent pericolonic fat stranding and small volume free fluid. There is free intraperitoneal air anterior to the sigmoid colon compatible with contained perforation (series 2/image 65). No abscess. The small bowel is dilated measuring up to 3.6 cm in diameter. With abrupt transition point in the low pelvis adjacent to the inflamed sigmoid colon (series 2/image 69). The small bowel in this area demonstrates wall thickening and hyperenhancement. Appendectomy.  Small hiatal hernia. Vascular/Lymphatic: Aortic atherosclerosis. No enlarged abdominal or pelvic lymph nodes. Reproductive: Hysterectomy. Other: No abdominal wall hernia. Musculoskeletal: No acute fracture. IMPRESSION: 1. Acute sigmoid diverticulitis with contained perforation. No abscess. 2. Small bowel obstruction with abrupt transition point in the low pelvis adjacent to the inflamed sigmoid colon. The small bowel in this area demonstrates wall thickening and hyperenhancement, which may be reactive to the adjacent perforated diverticulitis. 3. Indeterminate bilateral enhancing adrenal nodules measuring up to 1.2 cm on the left and 1.0 cm on the right. These likely represent benign adenomas. Comparison with prior imaging is recommended if available. Otherwise 1 year follow-up adrenal washout CT is recommended. Once 1 year of stability has been documented, no further follow-up imaging is  recommended. Aortic Atherosclerosis (ICD10-I70.0). Critical Value/emergent results were called by telephone  at the time of interpretation on 05/11/2023 at 11:52 pm to provider Dr. Bernette Mayers, who verbally acknowledged these results. Electronically Signed   By: Minerva Fester M.D.   On: 05/11/2023 23:53    Anti-infectives: Anti-infectives (From admission, onward)    Start     Dose/Rate Route Frequency Ordered Stop   05/12/23 2200  metroNIDAZOLE (FLAGYL) IVPB 500 mg        500 mg 100 mL/hr over 60 Minutes Intravenous Every 12 hours 05/12/23 0753     05/12/23 1200  ceFEPIme (MAXIPIME) 2 g in sodium chloride 0.9 % 100 mL IVPB        2 g 200 mL/hr over 30 Minutes Intravenous Every 12 hours 05/12/23 0840     05/11/23 2300  ceFEPIme (MAXIPIME) 2 g in sodium chloride 0.9 % 100 mL IVPB       Placed in "And" Linked Group   2 g 200 mL/hr over 30 Minutes Intravenous  Once 05/11/23 2258 05/12/23 0016   05/11/23 2300  metroNIDAZOLE (FLAGYL) IVPB 500 mg       Placed in "And" Linked Group   500 mg 100 mL/hr over 60 Minutes Intravenous  Once 05/11/23 2258 05/12/23 0137       Assessment/Plan: Acute diverticulitis with microperforation, possible evolving small bowel obstruction   Less pain and improving WBC Leave on clears for now  KUB  Recheck CBC in am   Continue ABX    LOS: 1 day    Dortha Schwalbe MD  05/13/2023 Moderate complexity

## 2023-05-13 NOTE — Plan of Care (Signed)

## 2023-05-13 NOTE — Progress Notes (Signed)
PROGRESS NOTE    BETHE PADUANO  AVW:[id number] DOB: 09/29/1955 DOA: 05/11/2023 PCP: Tollie Eth, NP   Brief Narrative:  This 67 y.o. female with PMH significant for depression, GAD, GERD, hyperlipidemia presented in the ED with complaints of  abdominal pain, CT abdomen pelvis showed Acute sigmoid diverticulitis with contained perforation. No abscess.Small bowel obstruction with abrupt transition point in the low pelvis adjacent to the inflamed sigmoid colon.  General surgery was consulted.  Patient was started on IV antibiotics.  Patient had made improvement.  Started on clear liquid diet.  Assessment & Plan:   Principal Problem:   Acute diverticulitis Active Problems:   Anxiety   Hyperlipidemia   Gastroesophageal reflux disease   Vitamin D deficiency   Perforation of sigmoid colon due to diverticulitis   Acute diverticulitis Patient presented with abdominal pain associated with decreased appetite, CT abdomen and pelvis shows acute sigmoid diverticulitis with contained perforation, no abscess. Initiated on bowel rest, IV hydration. Continue IV fluid resuscitation. Continue empiric antibiotics cefepime and Flagyl. General surgery was consulted.  Patient was able to pass flatus. Patient started on clear liquid diet, if she could not tolerate, consider NG tube compression. Patient has made improvement.  Diet advanced to full liquid diet.   Possible small bowel obstruction: CT abdomen and pelvis showed SBO with transition point in the lower pelvis adjacent to the inflamed sigmoid colon.   Patient started on clear liquid diet for now, advanced to full liquid. Continue IV antibiotics. If she developed nausea and vomiting consider NG tube.   No surgical intervention needed at this time.   Generalized anxiety disorder/depression: Continue Lexapro.  Hyperlipidemia Continue statin.   DVT prophylaxis: Lovenox Code Status: Full code Family Communication: Spoke with daughter  at bedside. Disposition Plan:  Status is: Inpatient Remains inpatient appropriate because: Admitted for acute diverticulitis requiring bowel rest,  IV hydration and pain control. Continue IV antibiotics.  General surgery consultation,  conservative management.    Consultants:  General surgery  Procedures: CT A/P  Antimicrobials:  Anti-infectives (From admission, onward)    Start     Dose/Rate Route Frequency Ordered Stop   05/12/23 2200  metroNIDAZOLE (FLAGYL) IVPB 500 mg        500 mg 100 mL/hr over 60 Minutes Intravenous Every 12 hours 05/12/23 0753     05/12/23 1200  ceFEPIme (MAXIPIME) 2 g in sodium chloride 0.9 % 100 mL IVPB        2 g 200 mL/hr over 30 Minutes Intravenous Every 12 hours 05/12/23 0840     05/11/23 2300  ceFEPIme (MAXIPIME) 2 g in sodium chloride 0.9 % 100 mL IVPB       Placed in "And" Linked Group   2 g 200 mL/hr over 30 Minutes Intravenous  Once 05/11/23 2258 05/12/23 0016   05/11/23 2300  metroNIDAZOLE (FLAGYL) IVPB 500 mg       Placed in "And" Linked Group   500 mg 100 mL/hr over 60 Minutes Intravenous  Once 05/11/23 2258 05/12/23 8295      Subjective: Patient was seen and examined at bedside.  Overnight events noted. Patient reports doing better,  abdominal pain has improved.  She is able to pass flatus and having diarrhea.   Objective: Vitals:   05/12/23 1124 05/12/23 1656 05/12/23 2054 05/13/23 0450  BP: 108/65 104/67 117/62 (!) 121/56  Pulse: 86 84 79 80  Resp: 18  17 16   Temp: 98.3 F (36.8 C) 98.2 F (36.8 C) 98.2 F (  36.8 C) 97.8 F (36.6 C)  TempSrc: Oral Oral Oral Oral  SpO2: 99% 99% 99% 96%  Weight:      Height:        Intake/Output Summary (Last 24 hours) at 05/13/2023 1138 Last data filed at 05/13/2023 0842 Gross per 24 hour  Intake 2432.98 ml  Output 400 ml  Net 2032.98 ml   Filed Weights   05/11/23 2204  Weight: 72.6 kg    Examination:  General exam: Appears calm and comfortable, not in any distress. Respiratory  system: Clear to auscultation. Respiratory effort normal.  RR 16 Cardiovascular system: S1 & S2 heard, RRR. No JVD, murmurs, rubs, gallops or clicks. No pedal edema. Gastrointestinal system: Abdomen is non distended, soft and mildly tender. Normal bowel sounds heard. Central nervous system: Alert and oriented x 3. No focal neurological deficits. Extremities: Symmetric 5 x 5 power. Skin: No rashes, lesions or ulcers Psychiatry: Judgement and insight appear normal. Mood & affect appropriate.     Data Reviewed: I have personally reviewed following labs and imaging studies  CBC: Recent Labs  Lab 05/11/23 2205 05/12/23 0843 05/13/23 0457  WBC 22.2* 15.9* 12.5*  NEUTROABS 17.5*  --   --   HGB 13.9 11.7* 12.9  HCT 40.6 35.6* 39.9  MCV 90.4 93.2 96.6  PLT 241 196 229   Basic Metabolic Panel: Recent Labs  Lab 05/11/23 2205 05/12/23 0843 05/13/23 0706  NA 133*  --  138  K 3.5  --  3.7  CL 99  --  108  CO2 23  --  22  GLUCOSE 110*  --  78  BUN 16  --  18  CREATININE 0.93 0.79 0.85  CALCIUM 9.4  --  8.4*  MG  --   --  1.9  PHOS  --   --  2.6   GFR: Estimated Creatinine Clearance: 64.5 mL/min (by C-G formula based on SCr of 0.85 mg/dL). Liver Function Tests: Recent Labs  Lab 05/11/23 2205 05/13/23 0706  AST 12* 11*  ALT 12 13  ALKPHOS 60 52  BILITOT 1.3* 0.7  PROT 7.5 6.1*  ALBUMIN 4.1 3.1*   Recent Labs  Lab 05/11/23 2205  LIPASE 11   No results for input(s): "AMMONIA" in the last 168 hours. Coagulation Profile: No results for input(s): "INR", "PROTIME" in the last 168 hours. Cardiac Enzymes: No results for input(s): "CKTOTAL", "CKMB", "CKMBINDEX", "TROPONINI" in the last 168 hours. BNP (last 3 results) No results for input(s): "PROBNP" in the last 8760 hours. HbA1C: No results for input(s): "HGBA1C" in the last 72 hours. CBG: No results for input(s): "GLUCAP" in the last 168 hours. Lipid Profile: No results for input(s): "CHOL", "HDL", "LDLCALC", "TRIG",  "CHOLHDL", "LDLDIRECT" in the last 72 hours. Thyroid Function Tests: No results for input(s): "TSH", "T4TOTAL", "FREET4", "T3FREE", "THYROIDAB" in the last 72 hours. Anemia Panel: No results for input(s): "VITAMINB12", "FOLATE", "FERRITIN", "TIBC", "IRON", "RETICCTPCT" in the last 72 hours. Sepsis Labs: Recent Labs  Lab 05/11/23 2209  LATICACIDVEN 0.6    No results found for this or any previous visit (from the past 240 hour(s)).   Radiology Studies: CT ABDOMEN PELVIS W CONTRAST  Result Date: 05/11/2023 CLINICAL DATA:  Lower abdominal pain, bloating, loss of appetite, diarrhea. History of colitis EXAM: CT ABDOMEN AND PELVIS WITH CONTRAST TECHNIQUE: Multidetector CT imaging of the abdomen and pelvis was performed using the standard protocol following bolus administration of intravenous contrast. RADIATION DOSE REDUCTION: This exam was performed according to the departmental  dose-optimization program which includes automated exposure control, adjustment of the mA and/or kV according to patient size and/or use of iterative reconstruction technique. CONTRAST:  OMNIPAQUE IOHEXOL 300 MG/ML  SOLN COMPARISON:  None Available. FINDINGS: Lower chest: No acute abnormality. Hepatobiliary: Unremarkable liver. Cholecystectomy. Prominent bile ducts due to reservoir effect. Pancreas: Unremarkable. Spleen: Unremarkable. Adrenals/Urinary Tract: 1.2 cm enhancing left adrenal nodule (Hounsfield units 122) and 1.0 cm enhancing right adrenal nodule (Hounsfield units 110). No urinary calculi or hydronephrosis.  Unremarkable bladder. Stomach/Bowel: Sigmoid diverticulosis with associated wall thickening and mucosal hyperenhancement. Adjacent pericolonic fat stranding and small volume free fluid. There is free intraperitoneal air anterior to the sigmoid colon compatible with contained perforation (series 2/image 65). No abscess. The small bowel is dilated measuring up to 3.6 cm in diameter. With abrupt transition point  in the low pelvis adjacent to the inflamed sigmoid colon (series 2/image 69). The small bowel in this area demonstrates wall thickening and hyperenhancement. Appendectomy.  Small hiatal hernia. Vascular/Lymphatic: Aortic atherosclerosis. No enlarged abdominal or pelvic lymph nodes. Reproductive: Hysterectomy. Other: No abdominal wall hernia. Musculoskeletal: No acute fracture. IMPRESSION: 1. Acute sigmoid diverticulitis with contained perforation. No abscess. 2. Small bowel obstruction with abrupt transition point in the low pelvis adjacent to the inflamed sigmoid colon. The small bowel in this area demonstrates wall thickening and hyperenhancement, which may be reactive to the adjacent perforated diverticulitis. 3. Indeterminate bilateral enhancing adrenal nodules measuring up to 1.2 cm on the left and 1.0 cm on the right. These likely represent benign adenomas. Comparison with prior imaging is recommended if available. Otherwise 1 year follow-up adrenal washout CT is recommended. Once 1 year of stability has been documented, no further follow-up imaging is recommended. Aortic Atherosclerosis (ICD10-I70.0). Critical Value/emergent results were called by telephone at the time of interpretation on 05/11/2023 at 11:52 pm to provider Dr. Bernette Mayers, who verbally acknowledged these results. Electronically Signed   By: Minerva Fester M.D.   On: 05/11/2023 23:53     Scheduled Meds:  docusate sodium  100 mg Oral BID   enoxaparin (LOVENOX) injection  40 mg Subcutaneous Q24H   Continuous Infusions:  sodium chloride 75 mL/hr at 05/13/23 0123   ceFEPime (MAXIPIME) IV 2 g (05/13/23 0025)   metronidazole 500 mg (05/13/23 1047)     LOS: 1 day    Time spent: 35 mins    Willeen Niece, MD [facility]   If 7PM-7AM, please contact night-coverage

## 2023-05-13 NOTE — Progress Notes (Signed)
   05/13/23 0912  TOC Brief Assessment  Insurance and Status Reviewed  Patient has primary care physician Yes  Home environment has been reviewed home alone  Prior level of function: independent  Prior/Current Home Services No current home services  Social Determinants of Health Reivew SDOH reviewed no interventions necessary  Readmission risk has been reviewed Yes  Transition of care needs no transition of care needs at this time

## 2023-05-13 NOTE — Progress Notes (Signed)
PHARMACY NOTE:  ANTIMICROBIAL RENAL DOSAGE ADJUSTMENT  Current antimicrobial regimen includes a mismatch between antimicrobial dosage and estimated renal function.  As per policy approved by the Pharmacy & Therapeutics and Medical Executive Committees, the antimicrobial dosage will be adjusted accordingly.  Current antimicrobial dosage:  Cefepime 2gm IV every 12 hours  Indication: bowel perforation/ diverticulitis  Renal Function:  Estimated Creatinine Clearance: 64.5 mL/min (by C-G formula based on SCr of 0.85 mg/dL).    Antimicrobial dosage has been changed to:  Cefepime 2gm IV every 8hrs  Thank you for allowing pharmacy to be a part of this patient's care.  Josefa Half, [facility] 05/13/2023 1:13 PM

## 2023-05-14 DIAGNOSIS — K5792 Diverticulitis of intestine, part unspecified, without perforation or abscess without bleeding: Secondary | ICD-10-CM | POA: Diagnosis not present

## 2023-05-14 LAB — CBC
HCT: 34.8 % — ABNORMAL LOW (ref 36.0–46.0)
Hemoglobin: 11.6 g/dL — ABNORMAL LOW (ref 12.0–15.0)
MCH: 30.9 pg (ref 26.0–34.0)
MCHC: 33.3 g/dL (ref 30.0–36.0)
MCV: 92.8 fL (ref 80.0–100.0)
Platelets: 272 10*3/uL (ref 150–400)
RBC: 3.75 MIL/uL — ABNORMAL LOW (ref 3.87–5.11)
RDW: 13.8 % (ref 11.5–15.5)
WBC: 8.8 10*3/uL (ref 4.0–10.5)
nRBC: 0 % (ref 0.0–0.2)

## 2023-05-14 NOTE — Progress Notes (Signed)
Subjective/Chief Complaint: Reports that pain continues to improve. Some nausea this morning but then had a BM and passed flatus and now feels much better.     Objective: Vital signs in last 24 hours: Temp:  [97.7 F (36.5 C)-98.6 F (37 C)] 97.7 F (36.5 C) (09/03 0450) Pulse Rate:  [64-77] 64 (09/03 0450) Resp:  [16-18] 16 (09/03 0450) BP: (107-124)/(61-70) 124/70 (09/03 0450) SpO2:  [96 %-100 %] 99 % (09/03 0450) Last BM Date : 06/10/23  Intake/Output from previous day: 09/02 0701 - 09/03 0700 In: 3346.7 [P.O.:1380; I.V.:1566.8; IV Piggyback:399.9] Out: 100 [Urine:100] Intake/Output this shift: No intake/output data recorded.  Abd: soft less tender LLQ  Obese mild distention no rebound or guarding   Lab Results:  Recent Labs    05/13/23 0457 05/14/23 0435  WBC 12.5* 8.8  HGB 12.9 11.6*  HCT 39.9 34.8*  PLT 229 272   BMET Recent Labs    05/11/23 2205 05/12/23 0843 05/13/23 0706  NA 133*  --  138  K 3.5  --  3.7  CL 99  --  108  CO2 23  --  22  GLUCOSE 110*  --  78  BUN 16  --  18  CREATININE 0.93 0.79 0.85  CALCIUM 9.4  --  8.4*   PT/INR No results for input(s): "LABPROT", "INR" in the last 72 hours. ABG No results for input(s): "PHART", "HCO3" in the last 72 hours.  Invalid input(s): "PCO2", "PO2"  Studies/Results: DG Abd 1 View  Result Date: 05/13/2023 CLINICAL DATA:  Abdominal pain EXAM: ABDOMEN - 1 VIEW COMPARISON:  05/11/2023 FINDINGS: Progressive dilation of small bowel loops within the abdomen measuring approximately 4.1 cm in diameter (previously 3.7 cm). There is air seen throughout the colon. Scattered surgical clips within the abdomen. No gross free intraperitoneal air on single supine view. IMPRESSION: Progressive dilation of small bowel loops within the abdomen measuring approximately 4.1 cm in diameter (previously 3.7 cm). Findings remain concerning for small bowel obstruction. Electronically Signed   By: Duanne Guess D.O.   On:  05/13/2023 13:44    Anti-infectives: Anti-infectives (From admission, onward)    Start     Dose/Rate Route Frequency Ordered Stop   05/13/23 1400  ceFEPIme (MAXIPIME) 2 g in sodium chloride 0.9 % 100 mL IVPB        2 g 200 mL/hr over 30 Minutes Intravenous Every 8 hours 05/13/23 1312     05/12/23 2200  metroNIDAZOLE (FLAGYL) IVPB 500 mg        500 mg 100 mL/hr over 60 Minutes Intravenous Every 12 hours 05/12/23 0753     05/12/23 1200  ceFEPIme (MAXIPIME) 2 g in sodium chloride 0.9 % 100 mL IVPB  Status:  Discontinued        2 g 200 mL/hr over 30 Minutes Intravenous Every 12 hours 05/12/23 0840 05/13/23 1312   05/11/23 2300  ceFEPIme (MAXIPIME) 2 g in sodium chloride 0.9 % 100 mL IVPB       Placed in "And" Linked Group   2 g 200 mL/hr over 30 Minutes Intravenous  Once 05/11/23 2258 05/12/23 0016   05/11/23 2300  metroNIDAZOLE (FLAGYL) IVPB 500 mg       Placed in "And" Linked Group   500 mg 100 mL/hr over 60 Minutes Intravenous  Once 05/11/23 2258 05/12/23 0137       Assessment/Plan: Acute diverticulitis with microperforation, possible evolving small bowel obstruction   Okay to advance diet as tolerated Continue abx  LOS: 2 days    Moise Boring MD  05/14/2023 Moderate complexity

## 2023-05-14 NOTE — Progress Notes (Signed)
PROGRESS NOTE    Kari Rojas  GNF:[id number] DOB: 08-08-1956 DOA: 05/11/2023 PCP: Tollie Eth, NP   Brief Narrative:  This 67 y.o. female with PMH significant for depression, GAD, GERD, hyperlipidemia presented in the ED with complaints of Lower abdominal pain, CT abdomen pelvis showed Acute sigmoid diverticulitis with contained perforation. No abscess. Small bowel obstruction with abrupt transition point in the low pelvis adjacent to the inflamed sigmoid colon.  General surgery was consulted.  Patient was started on IV antibiotics.  Patient had made improvement.  Started on clear liquid diet.  Assessment & Plan:   Principal Problem:   Acute diverticulitis Active Problems:   Anxiety   Hyperlipidemia   Gastroesophageal reflux disease   Vitamin D deficiency   Perforation of sigmoid colon due to diverticulitis   Acute diverticulitis Patient presented with Lower abdominal pain associated with decreased appetite. CT abdomen and pelvis shows acute sigmoid diverticulitis with contained perforation, no abscess. Initiated on bowel rest, IV hydration. Continue IV fluid resuscitation. Continue empiric antibiotics cefepime and Flagyl. General surgery was consulted.  Patient was able to pass flatus. Patient started on clear liquid diet, advanced to full liquid to soft Patient has made improvement.  Diet advanced to soft  Anticipated discharge home tomorrow.   Possible small bowel obstruction: CT abdomen and pelvis showed SBO with transition point in the lower pelvis adjacent to the inflamed sigmoid colon.   Continue IV antibiotics. SBO improved. No surgical intervention needed at this time.   Generalized anxiety disorder/depression: Continue Lexapro.  Hyperlipidemia Continue statin.   DVT prophylaxis: Lovenox Code Status: Full code Family Communication: Spoke with daughter at bedside. Disposition Plan:  Status is: Inpatient Remains inpatient appropriate because: Admitted  for acute diverticulitis requiring bowel rest,  IV hydration and pain control. Continue IV antibiotics.  General surgery consultation,  conservative management.    Consultants:  General surgery  Procedures: CT A/P  Antimicrobials:  Anti-infectives (From admission, onward)    Start     Dose/Rate Route Frequency Ordered Stop   05/13/23 1400  ceFEPIme (MAXIPIME) 2 g in sodium chloride 0.9 % 100 mL IVPB        2 g 200 mL/hr over 30 Minutes Intravenous Every 8 hours 05/13/23 1312     05/12/23 2200  metroNIDAZOLE (FLAGYL) IVPB 500 mg        500 mg 100 mL/hr over 60 Minutes Intravenous Every 12 hours 05/12/23 0753     05/12/23 1200  ceFEPIme (MAXIPIME) 2 g in sodium chloride 0.9 % 100 mL IVPB  Status:  Discontinued        2 g 200 mL/hr over 30 Minutes Intravenous Every 12 hours 05/12/23 0840 05/13/23 1312   05/11/23 2300  ceFEPIme (MAXIPIME) 2 g in sodium chloride 0.9 % 100 mL IVPB       Placed in "And" Linked Group   2 g 200 mL/hr over 30 Minutes Intravenous  Once 05/11/23 2258 05/12/23 0016   05/11/23 2300  metroNIDAZOLE (FLAGYL) IVPB 500 mg       Placed in "And" Linked Group   500 mg 100 mL/hr over 60 Minutes Intravenous  Once 05/11/23 2258 05/12/23 8469      Subjective: Patient was seen and examined at bedside. Overnight events noted. Patient reports feeling much improved. Abdominal pain has improved. She is able to pass flatus, has tolerated full liquid diet , advanced to soft diet today.   Objective: Vitals:   05/13/23 0450 05/13/23 1319 05/13/23 2155 05/14/23 0450  BP: (!) 121/56 117/66 107/61 124/70  Pulse: 80 75 77 64  Resp: 16 17 18 16   Temp: 97.8 F (36.6 C) 98.6 F (37 C) 97.7 F (36.5 C) 97.7 F (36.5 C)  TempSrc: Oral Oral Oral Oral  SpO2: 96% 96% 100% 99%  Weight:      Height:        Intake/Output Summary (Last 24 hours) at 05/14/2023 1211 Last data filed at 05/14/2023 1000 Gross per 24 hour  Intake 3226.69 ml  Output 100 ml  Net 3126.69 ml   Filed  Weights   05/11/23 2204  Weight: 72.6 kg    Examination:  General exam: Appears calm and comfortable, not in any distress. Respiratory system: CTA bilaterally.  Respiratory effort normal.  RR 15 Cardiovascular system: S1 & S2 heard, RRR. No JVD, murmurs, rubs, gallops or clicks. No pedal edema. Gastrointestinal system: Abdomen is non distended, soft and mildly tender. Normal bowel sounds heard. Central nervous system: Alert and oriented x 3. No focal neurological deficits. Extremities: Symmetric 5 x 5 power. Skin: No rashes, lesions or ulcers Psychiatry: Judgement and insight appear normal. Mood & affect appropriate.     Data Reviewed: I have personally reviewed following labs and imaging studies  CBC: Recent Labs  Lab 05/11/23 2205 05/12/23 0843 05/13/23 0457 05/14/23 0435  WBC 22.2* 15.9* 12.5* 8.8  NEUTROABS 17.5*  --   --   --   HGB 13.9 11.7* 12.9 11.6*  HCT 40.6 35.6* 39.9 34.8*  MCV 90.4 93.2 96.6 92.8  PLT 241 196 229 272   Basic Metabolic Panel: Recent Labs  Lab 05/11/23 2205 05/12/23 0843 05/13/23 0706  NA 133*  --  138  K 3.5  --  3.7  CL 99  --  108  CO2 23  --  22  GLUCOSE 110*  --  78  BUN 16  --  18  CREATININE 0.93 0.79 0.85  CALCIUM 9.4  --  8.4*  MG  --   --  1.9  PHOS  --   --  2.6   GFR: Estimated Creatinine Clearance: 64.5 mL/min (by C-G formula based on SCr of 0.85 mg/dL). Liver Function Tests: Recent Labs  Lab 05/11/23 2205 05/13/23 0706  AST 12* 11*  ALT 12 13  ALKPHOS 60 52  BILITOT 1.3* 0.7  PROT 7.5 6.1*  ALBUMIN 4.1 3.1*   Recent Labs  Lab 05/11/23 2205  LIPASE 11   No results for input(s): "AMMONIA" in the last 168 hours. Coagulation Profile: No results for input(s): "INR", "PROTIME" in the last 168 hours. Cardiac Enzymes: No results for input(s): "CKTOTAL", "CKMB", "CKMBINDEX", "TROPONINI" in the last 168 hours. BNP (last 3 results) No results for input(s): "PROBNP" in the last 8760 hours. HbA1C: No results  for input(s): "HGBA1C" in the last 72 hours. CBG: No results for input(s): "GLUCAP" in the last 168 hours. Lipid Profile: No results for input(s): "CHOL", "HDL", "LDLCALC", "TRIG", "CHOLHDL", "LDLDIRECT" in the last 72 hours. Thyroid Function Tests: No results for input(s): "TSH", "T4TOTAL", "FREET4", "T3FREE", "THYROIDAB" in the last 72 hours. Anemia Panel: No results for input(s): "VITAMINB12", "FOLATE", "FERRITIN", "TIBC", "IRON", "RETICCTPCT" in the last 72 hours. Sepsis Labs: Recent Labs  Lab 05/11/23 2209  LATICACIDVEN 0.6    No results found for this or any previous visit (from the past 240 hour(s)).   Radiology Studies: DG Abd 1 View  Result Date: 05/13/2023 CLINICAL DATA:  Abdominal pain EXAM: ABDOMEN - 1 VIEW COMPARISON:  05/11/2023 FINDINGS:  Progressive dilation of small bowel loops within the abdomen measuring approximately 4.1 cm in diameter (previously 3.7 cm). There is air seen throughout the colon. Scattered surgical clips within the abdomen. No gross free intraperitoneal air on single supine view. IMPRESSION: Progressive dilation of small bowel loops within the abdomen measuring approximately 4.1 cm in diameter (previously 3.7 cm). Findings remain concerning for small bowel obstruction. Electronically Signed   By: Duanne Guess D.O.   On: 05/13/2023 13:44     Scheduled Meds:  docusate sodium  100 mg Oral BID   enoxaparin (LOVENOX) injection  40 mg Subcutaneous Q24H   Continuous Infusions:  sodium chloride 75 mL/hr at 05/14/23 0639   ceFEPime (MAXIPIME) IV 2 g (05/14/23 0641)   metronidazole 500 mg (05/14/23 1147)     LOS: 2 days    Time spent: 35 mins    Willeen Niece, MD [facility]   If 7PM-7AM, please contact night-coverage

## 2023-05-14 NOTE — Progress Notes (Signed)
Pharmacy Antibiotic Note  Kari Rojas is a 67 y.o. female admitted on 05/11/2023 with   Acute sigmoid diverticulitis with contained perforation. Marland Kitchen  Pharmacy has been consulted for Cefepime dosing.  ID: Acute sigmoid diverticulitis with contained perforation. Afebrile, WBC 8.8, Scr <1, no cultures  Antimicrobials this admission: 8/31 Cefepime >>  8/31 Metronidazole >>  Plan: Cefepime 2g IV q8hr Pharmacy will sign off. Please reconsult for further dosing assitance. Dose ok for CrCl>60     Height: 5\' 4"  (162.6 cm) Weight: 72.6 kg (160 lb) IBW/kg (Calculated) : 54.7  Temp (24hrs), Avg:98 F (36.7 C), Min:97.7 F (36.5 C), Max:98.6 F (37 C)  Recent Labs  Lab 05/11/23 2205 05/11/23 2209 05/12/23 0843 05/13/23 0457 05/13/23 0706 05/14/23 0435  WBC 22.2*  --  15.9* 12.5*  --  8.8  CREATININE 0.93  --  0.79  --  0.85  --   LATICACIDVEN  --  0.6  --   --   --   --     Estimated Creatinine Clearance: 64.5 mL/min (by C-G formula based on SCr of 0.85 mg/dL).    Allergies  Allergen Reactions   Latex    Penicillins Hives    Kari Rojas S. Kari Rojas, PharmD, BCPS Clinical Staff Pharmacist [email or web address]  Kari Rojas 05/14/2023 10:42 AM

## 2023-05-15 DIAGNOSIS — K5792 Diverticulitis of intestine, part unspecified, without perforation or abscess without bleeding: Secondary | ICD-10-CM | POA: Diagnosis not present

## 2023-05-15 LAB — CBC
HCT: 32.6 % — ABNORMAL LOW (ref 36.0–46.0)
Hemoglobin: 10.7 g/dL — ABNORMAL LOW (ref 12.0–15.0)
MCH: 31.2 pg (ref 26.0–34.0)
MCHC: 32.8 g/dL (ref 30.0–36.0)
MCV: 95 fL (ref 80.0–100.0)
Platelets: 267 10*3/uL (ref 150–400)
RBC: 3.43 MIL/uL — ABNORMAL LOW (ref 3.87–5.11)
RDW: 14 % (ref 11.5–15.5)
WBC: 7 10*3/uL (ref 4.0–10.5)
nRBC: 0 % (ref 0.0–0.2)

## 2023-05-15 MED ORDER — SACCHAROMYCES BOULARDII 250 MG PO CAPS: 250.0000 mg | ORAL_CAPSULE | Freq: Two times a day (BID) | ORAL | 0 refills | Status: AC

## 2023-05-15 MED ORDER — METRONIDAZOLE 500 MG PO TABS: 500.0000 mg | ORAL_TABLET | Freq: Two times a day (BID) | ORAL | Status: DC

## 2023-05-15 MED ORDER — CIPROFLOXACIN HCL 500 MG PO TABS: 500.0000 mg | ORAL_TABLET | Freq: Two times a day (BID) | ORAL | Status: DC

## 2023-05-15 MED ORDER — TRAMADOL HCL 50 MG PO TABS
50.0000 mg | ORAL_TABLET | Freq: Three times a day (TID) | ORAL | 0 refills | Status: AC | PRN
Start: 2023-05-15 — End: 2023-05-25

## 2023-05-15 MED ORDER — METRONIDAZOLE 500 MG PO TABS: 500.0000 mg | ORAL_TABLET | Freq: Two times a day (BID) | ORAL | 0 refills | Status: AC

## 2023-05-15 MED ORDER — CIPROFLOXACIN HCL 500 MG PO TABS: 500.0000 mg | ORAL_TABLET | Freq: Two times a day (BID) | ORAL | 0 refills | Status: AC

## 2023-05-15 NOTE — Hospital Course (Addendum)
67 y.o. female with PMH significant for depression, GAD, GERD, hyperlipidemia presented in the ED with complaints of Lower abdominal pain, CT abdomen pelvis showed Acute sigmoid diverticulitis with contained perforation. No abscess. Small bowel obstruction with abrupt transition point in the low pelvis adjacent to the inflamed sigmoid colon.  General surgery was consulted.  Patient was started on IV antibiotics.  Patient had made improvement.  Started on clear liquid diet> advanced, has been tolerating diet well on soft diet.  Managed for acute diverticulitis with microperforation possible evolving small bowel obstruction treated conservatively per general surgery.  At this time is clinically improved for discharge.seen by general surgery since patient is tolerating diet, advised discharge home and they will arrange for outpatient follow-u

## 2023-05-15 NOTE — Progress Notes (Signed)
Mobility Specialist - Progress Note   05/15/23 0900  Mobility  Activity Ambulated with assistance in hallway  Level of Assistance Modified independent, requires aide device or extra time  Assistive Device None  Distance Ambulated (ft) 500 ft  Range of Motion/Exercises Active  Activity Response Tolerated well  Mobility Referral Yes  $Mobility charge 1 Mobility  Mobility Specialist Start Time (ACUTE ONLY) 0847  Mobility Specialist Stop Time (ACUTE ONLY) 0857  Mobility Specialist Time Calculation (min) (ACUTE ONLY) 10 min   Pt received in bed and agreed to mobility. Had no issues throughout session, returned to bed with all needs met.  Marilynne Halsted Mobility Specialist

## 2023-05-15 NOTE — Progress Notes (Signed)
   Subjective/Chief Complaint: Tolerated a regular diet and denies pain or nausea.  +BM. + flatus   Objective: Vital signs in last 24 hours: Temp:  [97.9 F (36.6 C)-98.2 F (36.8 C)] 98.2 F (36.8 C) (09/04 0510) Pulse Rate:  [66-74] 66 (09/04 0510) Resp:  [18] 18 (09/04 0510) BP: (114-132)/(66-80) 121/66 (09/04 0510) SpO2:  [99 %-100 %] 99 % (09/04 0510) Last BM Date : 05/15/23  Intake/Output from previous day: 09/03 0701 - 09/04 0700 In: 930.4 [P.O.:240; I.V.:397.4; IV Piggyback:292.9] Out: 0  Intake/Output this shift: Total I/O In: 240 [P.O.:240] Out: -   Abd: soft, non-distended, mild TTP of the LLQ with deep palpation  Lab Results:  Recent Labs    05/14/23 0435 05/15/23 0509  WBC 8.8 7.0  HGB 11.6* 10.7*  HCT 34.8* 32.6*  PLT 272 267   BMET Recent Labs    05/13/23 0706  NA 138  K 3.7  CL 108  CO2 22  GLUCOSE 78  BUN 18  CREATININE 0.85  CALCIUM 8.4*   PT/INR No results for input(s): "LABPROT", "INR" in the last 72 hours. ABG No results for input(s): "PHART", "HCO3" in the last 72 hours.  Invalid input(s): "PCO2", "PO2"  Studies/Results: DG Abd 1 View  Result Date: 05/13/2023 CLINICAL DATA:  Abdominal pain EXAM: ABDOMEN - 1 VIEW COMPARISON:  05/11/2023 FINDINGS: Progressive dilation of small bowel loops within the abdomen measuring approximately 4.1 cm in diameter (previously 3.7 cm). There is air seen throughout the colon. Scattered surgical clips within the abdomen. No gross free intraperitoneal air on single supine view. IMPRESSION: Progressive dilation of small bowel loops within the abdomen measuring approximately 4.1 cm in diameter (previously 3.7 cm). Findings remain concerning for small bowel obstruction. Electronically Signed   By: Duanne Guess D.O.   On: 05/13/2023 13:44    Anti-infectives: Anti-infectives (From admission, onward)    Start     Dose/Rate Route Frequency Ordered Stop   05/13/23 1400  ceFEPIme (MAXIPIME) 2 g in  sodium chloride 0.9 % 100 mL IVPB        2 g 200 mL/hr over 30 Minutes Intravenous Every 8 hours 05/13/23 1312     05/12/23 2200  metroNIDAZOLE (FLAGYL) IVPB 500 mg        500 mg 100 mL/hr over 60 Minutes Intravenous Every 12 hours 05/12/23 0753     05/12/23 1200  ceFEPIme (MAXIPIME) 2 g in sodium chloride 0.9 % 100 mL IVPB  Status:  Discontinued        2 g 200 mL/hr over 30 Minutes Intravenous Every 12 hours 05/12/23 0840 05/13/23 1312   05/11/23 2300  ceFEPIme (MAXIPIME) 2 g in sodium chloride 0.9 % 100 mL IVPB       Placed in "And" Linked Group   2 g 200 mL/hr over 30 Minutes Intravenous  Once 05/11/23 2258 05/12/23 0016   05/11/23 2300  metroNIDAZOLE (FLAGYL) IVPB 500 mg       Placed in "And" Linked Group   500 mg 100 mL/hr over 60 Minutes Intravenous  Once 05/11/23 2258 05/12/23 0137       Assessment/Plan: Acute diverticulitis with microperforation  Regular diet PO abx Okay for discharge from surgery perspective Will arrange follow up    LOS: 3 days    Moise Boring MD  05/15/2023 Moderate complexity

## 2023-05-15 NOTE — Plan of Care (Signed)

## 2023-05-15 NOTE — Discharge Summary (Signed)
Physician Discharge Summary  Kari Rojas ZOX:[id number] DOB: [birth year] DOA: 05/11/2023  PCP: Tollie Eth, NP  Admit date: 05/11/2023 Discharge date: 05/15/2023 Recommendations for Outpatient Follow-up:  Follow up with PCP in 1 weeks-call for appointment Please obtain BMP/CBC in one week  Discharge Dispo: home Discharge Condition: Stable Code Status:   Code Status: Full Code Diet recommendation:  Diet Order             DIET SOFT Room service appropriate? Yes; Fluid consistency: Thin  Diet effective now                    Brief/Interim Summary: 67 y.o. female with PMH significant for depression, GAD, GERD, hyperlipidemia presented in the ED with complaints of Lower abdominal pain, CT abdomen pelvis showed Acute sigmoid diverticulitis with contained perforation. No abscess. Small bowel obstruction with abrupt transition point in the low pelvis adjacent to the inflamed sigmoid colon.  General surgery was consulted.  Patient was started on IV antibiotics.  Patient had made improvement.  Started on clear liquid diet> advanced, has been tolerating diet well on soft diet.  Managed for acute diverticulitis with microperforation possible evolving small bowel obstruction treated conservatively per general surgery.  At this time is clinically improved for discharge.seen by general surgery since patient is tolerating diet, advised discharge home and they will arrange for outpatient follow-u   Discharge Diagnoses:  Principal Problem:   Acute diverticulitis Active Problems:   Anxiety   Hyperlipidemia   Gastroesophageal reflux disease   Vitamin D deficiency   Perforation of sigmoid colon due to diverticulitis    Acute sigmoid diverticulitis with microperforation-with contained perforation possible evolving small bowel obstruction:treated conservatively per general surgery.  At this time is clinically improved for discharge.  Continue antibiotics overall follow-up with general surgery  as outpatient will need colonoscopy in 4 to 6 weeks.  Leukocytosis resolved.  Will discharge on 1 more week of Cipro Flagyl patient received 3 days of IV antibiotics  GAD Depression: Continue Lexapro  HLD on statin Mild anemia: Likely in the setting of acute illness follow-up with PCP  Hypoalbuminemia, likely from acute illness recommend diet Mildly elevated T. bili normalized. Adrenal nodules measuring up to 1.2 cm on the left and 1.0 cm on the right. These likely represent benign adenomas. Comparison with prior imaging is recommended if available. Otherwise 1 year follow-up adrenal washout CT is recommended. Once 1 year of stability has been documented, no further follow-up imaging is recommended-this was placed in her discharge instruction for follow-up with PCP   Consults: CCS Subjective: AAOX3 tolerating po, wants to go home  Discharge Exam: Vitals:   05/14/23 2129 05/15/23 0510  BP: 132/80 121/66  Pulse: 74 66  Resp: 18 18  Temp: 98.2 F (36.8 C) 98.2 F (36.8 C)  SpO2: 100% 99%   General: Pt is alert, awake, not in acute distress Cardiovascular: RRR, S1/S2 +, no rubs, no gallops Respiratory: CTA bilaterally, no wheezing, no rhonchi Abdominal: Soft, NT, ND, bowel sounds + Extremities: no edema, no cyanosis  Discharge Instructions  Discharge Instructions     Discharge instructions   Complete by: As directed    Please call call MD or return to ER for similar or worsening recurring problem that brought you to hospital or if any fever,nausea/vomiting,abdominal pain, uncontrolled pain, chest pain,  shortness of breath or any other alarming symptoms.   You are found to have incidentally noticed adrenal nodules measuring up to 1.2 cm on the  left and 1.0 cm on the right. These likely represent benign adenomas. Comparison with prior imaging is recommended if available. Otherwise 1 year follow-up adrenal washout CT is recommended. Once 1 year of stability has been documented,  no further follow-up imaging is recommended  Follow up with surgery as outpatient.  Please follow-up your doctor as instructed in a week time and call the office for appointment.  Please avoid alcohol, smoking, or any other illicit substance and maintain healthy habits including taking your regular medications as prescribed.  You were cared for by a hospitalist during your hospital stay. If you have any questions about your discharge medications or the care you received while you were in the hospital after you are discharged, you can call the unit and ask to speak with the hospitalist on call if the hospitalist that took care of you is not available.  Once you are discharged, your primary care physician will handle any further medical issues. Please note that NO REFILLS for any discharge medications will be authorized once you are discharged, as it is imperative that you return to your primary care physician (or establish a relationship with a primary care physician if you do not have one) for your aftercare needs so that they can reassess your need for medications and monitor your lab values   Increase activity slowly   Complete by: As directed       Allergies as of 05/15/2023       Reactions   Latex    Penicillins Hives        Medication List     TAKE these medications    ciprofloxacin 500 MG tablet Commonly known as: CIPRO Take 1 tablet (500 mg total) by mouth 2 (two) times daily for 7 days.   escitalopram 10 MG tablet Commonly known as: LEXAPRO Take 10 mg by mouth daily.   lovastatin 10 MG tablet Commonly known as: MEVACOR TAKE 1 TABLET (10 MG TOTAL) BY MOUTH DAILY FOR 90 DOSES   metroNIDAZOLE 500 MG tablet Commonly known as: FLAGYL Take 1 tablet (500 mg total) by mouth every 12 (twelve) hours for 7 days.   omeprazole 10 MG capsule Commonly known as: PRILOSEC Take 10 mg by mouth daily as needed (Indigestion).   ONE-A-DAY WOMENS PO Take 1 tablet by mouth daily.    saccharomyces boulardii 250 MG capsule Commonly known as: Florastor Take 1 capsule (250 mg total) by mouth 2 (two) times daily for 14 days.   traMADol 50 MG tablet Commonly known as: Ultram Take 1 tablet (50 mg total) by mouth every 8 (eight) hours as needed for up to 10 days for severe pain.        Follow-up Information     Early, Sung Amabile, NP Follow up in 1 week(s).   Specialty: Nurse Practitioner Contact information: [address] [place] [address] [phone]         Harriette Bouillon, MD. Call in 1 week(s).   Specialty: General Surgery Contact information: [address] [place] [address] [phone]                Allergies  Allergen Reactions   Latex    Penicillins Hives    The results of significant diagnostics from this hospitalization (including imaging, microbiology, ancillary and laboratory) are listed below for reference.    Microbiology: No results found for this or any previous visit (from the past 240 hour(s)).  Procedures/Studies: DG Abd 1 View  Result Date:  05/13/2023 CLINICAL DATA:  Abdominal pain EXAM: ABDOMEN - 1 VIEW COMPARISON:  05/11/2023 FINDINGS: Progressive dilation of small bowel loops within the abdomen measuring approximately 4.1 cm in diameter (previously 3.7 cm). There is air seen throughout the colon. Scattered surgical clips within the abdomen. No gross free intraperitoneal air on single supine view. IMPRESSION: Progressive dilation of small bowel loops within the abdomen measuring approximately 4.1 cm in diameter (previously 3.7 cm). Findings remain concerning for small bowel obstruction. Electronically Signed   By: Duanne Guess D.O.   On: 05/13/2023 13:44   CT ABDOMEN PELVIS W CONTRAST  Result Date: 05/11/2023 CLINICAL DATA:  Lower abdominal pain, bloating, loss of appetite, diarrhea. History of colitis EXAM: CT ABDOMEN AND PELVIS WITH CONTRAST TECHNIQUE: Multidetector CT imaging of the abdomen  and pelvis was performed using the standard protocol following bolus administration of intravenous contrast. RADIATION DOSE REDUCTION: This exam was performed according to the departmental dose-optimization program which includes automated exposure control, adjustment of the mA and/or kV according to patient size and/or use of iterative reconstruction technique. CONTRAST:  OMNIPAQUE IOHEXOL 300 MG/ML  SOLN COMPARISON:  None Available. FINDINGS: Lower chest: No acute abnormality. Hepatobiliary: Unremarkable liver. Cholecystectomy. Prominent bile ducts due to reservoir effect. Pancreas: Unremarkable. Spleen: Unremarkable. Adrenals/Urinary Tract: 1.2 cm enhancing left adrenal nodule (Hounsfield units 122) and 1.0 cm enhancing right adrenal nodule (Hounsfield units 110). No urinary calculi or hydronephrosis.  Unremarkable bladder. Stomach/Bowel: Sigmoid diverticulosis with associated wall thickening and mucosal hyperenhancement. Adjacent pericolonic fat stranding and small volume free fluid. There is free intraperitoneal air anterior to the sigmoid colon compatible with contained perforation (series 2/image 65). No abscess. The small bowel is dilated measuring up to 3.6 cm in diameter. With abrupt transition point in the low pelvis adjacent to the inflamed sigmoid colon (series 2/image 69). The small bowel in this area demonstrates wall thickening and hyperenhancement. Appendectomy.  Small hiatal hernia. Vascular/Lymphatic: Aortic atherosclerosis. No enlarged abdominal or pelvic lymph nodes. Reproductive: Hysterectomy. Other: No abdominal wall hernia. Musculoskeletal: No acute fracture. IMPRESSION: 1. Acute sigmoid diverticulitis with contained perforation. No abscess. 2. Small bowel obstruction with abrupt transition point in the low pelvis adjacent to the inflamed sigmoid colon. The small bowel in this area demonstrates wall thickening and hyperenhancement, which may be reactive to the adjacent perforated  diverticulitis. 3. Indeterminate bilateral enhancing adrenal nodules measuring up to 1.2 cm on the left and 1.0 cm on the right. These likely represent benign adenomas. Comparison with prior imaging is recommended if available. Otherwise 1 year follow-up adrenal washout CT is recommended. Once 1 year of stability has been documented, no further follow-up imaging is recommended. Aortic Atherosclerosis (ICD10-I70.0). Critical Value/emergent results were called by telephone at the time of interpretation on 05/11/2023 at 11:52 pm to provider Dr. Bernette Mayers, who verbally acknowledged these results. Electronically Signed   By: Minerva Fester M.D.   On: 05/11/2023 23:53    Labs: BNP (last 3 results) No results for input(s): "BNP" in the last 8760 hours. Basic Metabolic Panel: Recent Labs  Lab 05/11/23 2205 05/12/23 0843 05/13/23 0706  NA 133*  --  138  K 3.5  --  3.7  CL 99  --  108  CO2 23  --  22  GLUCOSE 110*  --  78  BUN 16  --  18  CREATININE 0.93 0.79 0.85  CALCIUM 9.4  --  8.4*  MG  --   --  1.9  PHOS  --   --  2.6   Liver Function Tests: Recent Labs  Lab 05/11/23 2205 05/13/23 0706  AST 12* 11*  ALT 12 13  ALKPHOS 60 52  BILITOT 1.3* 0.7  PROT 7.5 6.1*  ALBUMIN 4.1 3.1*   Recent Labs  Lab 05/11/23 2205  LIPASE 11   No results for input(s): "AMMONIA" in the last 168 hours. CBC: Recent Labs  Lab 05/11/23 2205 05/12/23 0843 05/13/23 0457 05/14/23 0435 05/15/23 0509  WBC 22.2* 15.9* 12.5* 8.8 7.0  NEUTROABS 17.5*  --   --   --   --   HGB 13.9 11.7* 12.9 11.6* 10.7*  HCT 40.6 35.6* 39.9 34.8* 32.6*  MCV 90.4 93.2 96.6 92.8 95.0  PLT 241 196 229 272 267   Cardiac Enzymes: No results for input(s): "CKTOTAL", "CKMB", "CKMBINDEX", "TROPONINI" in the last 168 hours. BNP: Invalid input(s): "POCBNP" CBG: No results for input(s): "GLUCAP" in the last 168 hours. D-Dimer No results for input(s): "DDIMER" in the last 72 hours. Hgb A1c No results for input(s): "HGBA1C" in  the last 72 hours. Lipid Profile No results for input(s): "CHOL", "HDL", "LDLCALC", "TRIG", "CHOLHDL", "LDLDIRECT" in the last 72 hours. Thyroid function studies No results for input(s): "TSH", "T4TOTAL", "T3FREE", "THYROIDAB" in the last 72 hours.  Invalid input(s): "FREET3" Anemia work up No results for input(s): "VITAMINB12", "FOLATE", "FERRITIN", "TIBC", "IRON", "RETICCTPCT" in the last 72 hours. Urinalysis    Component Value Date/Time   COLORURINE YELLOW 05/11/2023 2205   APPEARANCEUR CLEAR 05/11/2023 2205   LABSPEC >1.046 (H) 05/11/2023 2205   LABSPEC 1.015 03/03/2021 1216   PHURINE 7.0 05/11/2023 2205   GLUCOSEU NEGATIVE 05/11/2023 2205   HGBUR MODERATE (A) 05/11/2023 2205   BILIRUBINUR NEGATIVE 05/11/2023 2205   BILIRUBINUR negative 03/03/2021 1216   KETONESUR 15 (A) 05/11/2023 2205   PROTEINUR 30 (A) 05/11/2023 2205   UROBILINOGEN 0.2 12/29/2020 1015   NITRITE NEGATIVE 05/11/2023 2205   LEUKOCYTESUR NEGATIVE 05/11/2023 2205   Sepsis Labs Recent Labs  Lab 05/12/23 0843 05/13/23 0457 05/14/23 0435 05/15/23 0509  WBC 15.9* 12.5* 8.8 7.0   Microbiology No results found for this or any previous visit (from the past 240 hour(s)).   Time coordinating discharge: 25 minutes  SIGNED: Lanae Boast, MD  [facility] 05/15/2023, 10:36 AM  If 7PM-7AM, please contact night-coverage [email or web address]

## 2023-05-24 ENCOUNTER — Encounter: Payer: Self-pay | Admitting: Nurse Practitioner

## 2023-06-10 ENCOUNTER — Encounter: Payer: Self-pay | Admitting: Nurse Practitioner

## 2023-06-10 ENCOUNTER — Ambulatory Visit: Payer: 59 | Admitting: Nurse Practitioner

## 2023-06-10 VITALS — BP 120/60 | HR 83 | Ht 64.0 in | Wt 154.2 lb

## 2023-06-10 DIAGNOSIS — K5792 Diverticulitis of intestine, part unspecified, without perforation or abscess without bleeding: Secondary | ICD-10-CM | POA: Diagnosis not present

## 2023-06-10 DIAGNOSIS — K5900 Constipation, unspecified: Secondary | ICD-10-CM | POA: Diagnosis not present

## 2023-06-10 MED ORDER — NA SULFATE-K SULFATE-MG SULF 17.5-3.13-1.6 GM/177ML PO SOLN: 1.0000 | ORAL | 0 refills | Status: DC

## 2023-06-10 NOTE — Progress Notes (Signed)
ASSESSMENT & PLAN   67 y.o. yo female known to Dr. Lavon Paganini ( 2021).  She has a past medical history of cholelithiasis status post cholecystectomy, appendectomy, hysterectomy, GERD.  Referred by PCP for recent diverticulitis   Acute sigmoid diverticulitis with contained perforation requiring hospitalization 05/12/2023. Surgical intervention wasn't needed. She completed antibiotics ~ 10 days ago. Feels a lot better, just occasional twinges of LLQ discomfort and mild LLQ tenderness on exam. --It has been 3 years since her colonoscopy which was remarkable for sigmoid diverticulosis ; no polyps. I will schedule for repeat colonoscopy to be done in about 6-8 weeks pending discussion with Dr. Lavon Paganini, patient's primary GI . If repeat colonoscopy not felt to be needed then will cancel.  The risks and benefits of colonoscopy with possible polypectomy / biopsies were discussed and the patient agrees to proceed.  --Advised patient to call office in the interim if not continuing to improve / gets recurrent symptoms.   Resolved small bowel obstruction. Inpatient CT scan 05/12/23 >>Abrupt transition point in the low pelvis adjacent to the inflamed sigmoid colon. Reactive due to perforated diverticulitis.  She didn't have any significant nausea / vomiting. Didn't need NGT   Constipation, slightly worse since hospitalization of diverticulitis so could be lingering effects of pain meds ( no longer taking)  --Start Miralax 1/2 capful -1 capful daily in 8 oz water --Take 1-2 stool softeners at bedtime.  --See AVS for additional information.   HPI   Chief complaint :  recent diverticulitis  Kari Rojas went to ED 9/1 with two days of generalized abdominal with one episode vomiting. Admitted with diverticulitis with contained perforation. Also developed SBO. Didn't require NGT decompression. Surgery followed along. Improved with antibiotics. Has outpatient surgery follow up at some point.   CT AP with contrast 1.  Acute sigmoid diverticulitis with contained perforation. No abscess. 2. Small bowel obstruction with abrupt transition point in the low pelvis adjacent to the inflamed sigmoid colon. The small bowel in this area demonstrates wall thickening and hyperenhancement, which may be reactive to the adjacent perforated diverticulitis. 3. Indeterminate bilateral enhancing adrenal nodules measuring up to 1.2 cm on the left and 1.0 cm on the right. These likely represent benign adenomas. Comparison with prior imaging is recommended if available. Otherwise 1 year follow-up adrenal washout CT is recommended. Once 1 year of stability has been documented, no further follow-up imaging is recommended.  WBC was 22 K in ED, down to 12.5 a few days later. Overall she is better but some some " twinges " of LLQ pain at times and also some constipation . Stool hard and difficult to pass. Had taken pain meds in hospital.  She has increased water to 7-9 cups daily. Took a dulcolax but caused diarrhea.    Previous GI Studies   **May not be a complete list of studies  Aug 2021 - Diverticulosis in the sigmoid colon. - Non-bleeding internal hemorrhoids. - The examination was otherwise normal. - No specimens collected. Impression:   Labs      Latest Ref Rng & Units 05/15/2023    5:09 AM 05/14/2023    4:35 AM 05/13/2023    4:57 AM  CBC  WBC 4.0 - 10.5 K/uL 7.0  8.8  12.5   Hemoglobin 12.0 - 15.0 g/dL 91.4  78.2  95.6   Hematocrit 36.0 - 46.0 % 32.6  34.8  39.9   Platelets 150 - 400 K/uL 267  272  229     Lab  Results  Component Value Date   LIPASE 11 05/11/2023      Latest Ref Rng & Units 05/13/2023    7:06 AM 05/12/2023    8:43 AM 05/11/2023   10:05 PM  CMP  Glucose 70 - 99 mg/dL 78   161   BUN 8 - 23 mg/dL 18   16   Creatinine 0.96 - 1.00 mg/dL 0.45  4.09  8.11   Sodium 135 - 145 mmol/L 138   133   Potassium 3.5 - 5.1 mmol/L 3.7   3.5   Chloride 98 - 111 mmol/L 108   99   CO2 22 - 32 mmol/L 22   23    Calcium 8.9 - 10.3 mg/dL 8.4   9.4   Total Protein 6.5 - 8.1 g/dL 6.1   7.5   Total Bilirubin 0.3 - 1.2 mg/dL 0.7   1.3   Alkaline Phos 38 - 126 U/L 52   60   AST 15 - 41 U/L 11   12   ALT 0 - 44 U/L 13   12      Past Medical History:  Diagnosis Date   Allergy    Anemia    younger with menses    Depression    GAD (generalized anxiety disorder)    Gallstones    GERD (gastroesophageal reflux disease)    Hyperlipidemia    Osteoporosis    Past Surgical History:  Procedure Laterality Date   APPENDECTOMY     BREAST CYST EXCISION Right    40 + years ago, Benign   CHOLECYSTECTOMY     COLONOSCOPY     10 + yrs ago    HYSTERECTOMY ABDOMINAL WITH SALPINGO-OOPHORECTOMY     has one ovary    TONSILLECTOMY     UPPER GASTROINTESTINAL ENDOSCOPY     Family History  Adopted: Yes   Social History   Tobacco Use   Smoking status: Some Days    Current packs/day: 0.50    Average packs/day: 0.5 packs/day for 15.0 years (7.5 ttl pk-yrs)    Types: Cigarettes   Smokeless tobacco: Never  Vaping Use   Vaping status: Never Used  Substance Use Topics   Alcohol use: Yes    Comment: occ   Drug use: Never   Current Outpatient Medications  Medication Sig Dispense Refill   escitalopram (LEXAPRO) 10 MG tablet Take 10 mg by mouth daily.     Multiple Vitamins-Minerals (ONE-A-DAY WOMENS PO) Take 1 tablet by mouth daily.     omeprazole (PRILOSEC) 10 MG capsule Take 10 mg by mouth daily as needed (Indigestion).     lovastatin (MEVACOR) 10 MG tablet TAKE 1 TABLET (10 MG TOTAL) BY MOUTH DAILY FOR 90 DOSES 90 tablet 0   No current facility-administered medications for this visit.   Allergies  Allergen Reactions   Latex    Penicillins Hives     Review of Systems: All systems reviewed and negative except where noted in HPI.   Wt Readings from Last 3 Encounters:  06/10/23 154 lb 4 oz (70 kg)  05/11/23 160 lb (72.6 kg)  03/03/21 159 lb 3.2 oz (72.2 kg)    Physical Exam:  BP 120/60 (BP  Location: Left Arm)   Pulse 83   Ht 5\' 4"  (1.626 m)   Wt 154 lb 4 oz (70 kg)   SpO2 97%   BMI 26.48 kg/m  Constitutional:  Pleasant, generally well appearing female in no acute distress. Psychiatric:  Normal mood and affect.  Behavior is normal. EENT: Pupils normal.  Conjunctivae are normal. No scleral icterus. Neck supple.  Cardiovascular: Normal rate, regular rhythm.  Pulmonary/chest: Effort normal and breath sounds normal. No wheezing, rales or rhonchi. Abdominal: Soft, nondistended, mild LLQ tenderness. Bowel sounds active throughout. There are no masses palpable. No hepatomegaly. Neurological: Alert and oriented to person place and time. Extremities:  No edema Skin: Skin is warm and dry. No rashes noted.  Kari Cluster, NP  06/10/2023, 9:20 AM  Cc:  Referring Provider Shireen Quan, DO

## 2023-06-10 NOTE — Patient Instructions (Addendum)
_______________________________________________________  If your blood pressure at your visit was 140/90 or greater, please contact your primary care physician to follow up on this.  _______________________________________________________  If you are age 65 or older, your body mass index should be between 23-30. Your Body mass index is 26.48 kg/m. If this is out of the aforementioned range listed, please consider follow up with your Primary Care Provider. ________________________________________________________  The [facility] providers would like to encourage you to use [facility] to communicate with providers for non-urgent requests or questions.  Due to long hold times on the telephone, sending your provider a message by [facility] may be a faster and more efficient way to get a response.  Please allow 48 business hours for a response.  Please remember that this is for non-urgent requests.  _______________________________________________________  Bonita Quin have been scheduled for a colonoscopy. Please follow written instructions given to you at your visit today.   Please pick up your prep supplies at the pharmacy within the next 1-3 days.  If you use inhalers (even only as needed), please bring them with you on the day of your procedure.  DO NOT TAKE 7 DAYS PRIOR TO TEST- Trulicity (dulaglutide) Ozempic, Wegovy (semaglutide) Mounjaro (tirzepatide) Bydureon Bcise (exanatide extended release)  DO NOT TAKE 1 DAY PRIOR TO YOUR TEST Rybelsus (semaglutide) Adlyxin (lixisenatide) Victoza (liraglutide) Byetta (exanatide) ___________________________________________________________________________  Due to recent changes in healthcare laws, you may see the results of your imaging and laboratory studies on MyChart before your provider has had a chance to review them.  We understand that in some cases there may be results that are confusing or concerning to you. Not all laboratory results come back in the  same time frame and the provider may be waiting for multiple results in order to interpret others.  Please give [place] 48 hours in order for your provider to thoroughly review all the results before contacting the office for clarification of your results.    Things to help with constipation: - Drink 60 ounces of water daily  - Eat foods high in fiber. Examples include whole wheat products (especially wheat bran), quinoa, brown rice, legumes, leafy greens like kale, almonds, walnuts, seeds, and fruits with edible skins like pears and apples. - Take 1-2 stool softeners ( Docusate Sodium) at bedtime  - Take 1/2 to 1  capful of Miralax in 8 oz of water daily - Do not strain to have a bowel movement. You can use glycerin suppositories as needed to make it easier to pass stool.  - During a bowel movement, sit straight up ( do no lean over your knees), try to relax. If able, elevate feet 6-8 inches on a stool  Thank you for entrusting me with your care and choosing [facility].  Willette Cluster, NP

## 2023-07-24 ENCOUNTER — Encounter: Payer: Self-pay | Admitting: Gastroenterology

## 2023-08-05 ENCOUNTER — Other Ambulatory Visit: Payer: Self-pay | Admitting: Family Medicine

## 2023-08-05 DIAGNOSIS — Z1231 Encounter for screening mammogram for malignant neoplasm of breast: Secondary | ICD-10-CM

## 2023-08-13 ENCOUNTER — Encounter: Payer: Self-pay | Admitting: Gastroenterology

## 2023-08-13 ENCOUNTER — Ambulatory Visit: Payer: 59 | Admitting: Gastroenterology

## 2023-08-13 VITALS — BP 123/75 | HR 71 | Temp 97.9°F | Resp 13 | Ht 64.0 in | Wt 154.0 lb

## 2023-08-13 DIAGNOSIS — D122 Benign neoplasm of ascending colon: Secondary | ICD-10-CM | POA: Diagnosis not present

## 2023-08-13 DIAGNOSIS — D125 Benign neoplasm of sigmoid colon: Secondary | ICD-10-CM

## 2023-08-13 DIAGNOSIS — K5720 Diverticulitis of large intestine with perforation and abscess without bleeding: Secondary | ICD-10-CM | POA: Diagnosis present

## 2023-08-13 DIAGNOSIS — K5730 Diverticulosis of large intestine without perforation or abscess without bleeding: Secondary | ICD-10-CM

## 2023-08-13 DIAGNOSIS — K635 Polyp of colon: Secondary | ICD-10-CM | POA: Diagnosis not present

## 2023-08-13 DIAGNOSIS — K644 Residual hemorrhoidal skin tags: Secondary | ICD-10-CM

## 2023-08-13 DIAGNOSIS — K648 Other hemorrhoids: Secondary | ICD-10-CM

## 2023-08-13 DIAGNOSIS — K5792 Diverticulitis of intestine, part unspecified, without perforation or abscess without bleeding: Secondary | ICD-10-CM

## 2023-08-13 MED ORDER — SODIUM CHLORIDE 0.9 % IV SOLN: 500.0000 mL | Freq: Once | INTRAVENOUS | Status: DC

## 2023-08-13 NOTE — Op Note (Signed)
[facility] Patient Name: Kari Rojas Procedure Date: 08/13/2023 9:30 AM MRN: [id number] Endoscopist: Napoleon Form , MD, [id number] Age: 67 Referring MD:  Date of Birth: Jun 18, 1956 Gender: Female Account #: [id number] Procedure:                Colonoscopy Indications:              Diverticulitis, Follow-up of diverticulitis Medicines:                Monitored Anesthesia Care Procedure:                Pre-Anesthesia Assessment:                           - Prior to the procedure, a History and Physical                            was performed, and patient medications and                            allergies were reviewed. The patient's tolerance of                            previous anesthesia was also reviewed. The risks                            and benefits of the procedure and the sedation                            options and risks were discussed with the patient.                            All questions were answered, and informed consent                            was obtained. Prior Anticoagulants: The patient has                            taken no anticoagulant or antiplatelet agents. ASA                            Grade Assessment: II - A patient with mild systemic                            disease. After reviewing the risks and benefits,                            the patient was deemed in satisfactory condition to                            undergo the procedure.                           After obtaining informed consent, the colonoscope  was passed under direct vision. Throughout the                            procedure, the patient's blood pressure, pulse, and                            oxygen saturations were monitored continuously. The                            PCF-HQ190L Colonoscope [id number] was introduced                            through the anus and advanced to the the cecum,                            identified  by appendiceal orifice and ileocecal                            valve. The colonoscopy was performed without                            difficulty. The patient tolerated the procedure                            well. The quality of the bowel preparation was                            adequate. The ileocecal valve, appendiceal orifice,                            and rectum were photographed. Scope In: 9:41:08 AM Scope Out: 9:59:09 AM Scope Withdrawal Time: 0 hours 14 minutes 38 seconds  Total Procedure Duration: 0 hours 18 minutes 1 second  Findings:                 The perianal and digital rectal examinations were                            normal.                           A 14 mm polyp was found in the ascending colon. The                            polyp was mixed lateral spreading. The polyp was                            removed with a piecemeal technique using a cold                            snare. Resection and retrieval were complete.                           A 3 mm polyp was found in the sigmoid colon. The  polyp was sessile. The polyp was removed with a                            cold snare. Resection and retrieval were complete.                           Scattered medium-mouthed and small-mouthed                            diverticula were found in the sigmoid colon.                            Peri-diverticular erythema was seen.                           Non-bleeding external and internal hemorrhoids were                            found during retroflexion. The hemorrhoids were                            medium-sized. Complications:            No immediate complications. Estimated Blood Loss:     Estimated blood loss was minimal. Impression:               - One 14 mm polyp in the ascending colon, removed                            piecemeal using a cold snare. Resected and                            retrieved.                           - One 3  mm polyp in the sigmoid colon, removed with                            a cold snare. Resected and retrieved.                           - Mild diverticulosis in the sigmoid colon.                            Peri-diverticular erythema was seen.                           - Non-bleeding external and internal hemorrhoids. Recommendation:           - Patient has a contact number available for                            emergencies. The signs and symptoms of potential                            delayed complications were discussed with the  patient. Return to normal activities tomorrow.                            Written discharge instructions were provided to the                            patient.                           - Resume previous diet.                           - Continue present medications.                           - Await pathology results.                           - Repeat colonoscopy in 3 years for surveillance                            based on pathology results. Napoleon Form, MD 08/13/2023 10:03:47 AM This report has been signed electronically.

## 2023-08-13 NOTE — Progress Notes (Signed)
Vss nad trans to pacu 

## 2023-08-13 NOTE — Patient Instructions (Addendum)
Handouts provided on polyps, diverticulosis and hemorrhoids.  Resume previous diet.  Continue present medications.  Await pathology results.  Repeat colonoscopy in 3 years for surveillance based on pathology results.   YOU HAD AN ENDOSCOPIC PROCEDURE TODAY AT THE [facility]:   Refer to the procedure report that was given to you for any specific questions about what was found during the examination.  If the procedure report does not answer your questions, please call your gastroenterologist to clarify.  If you requested that your care partner not be given the details of your procedure findings, then the procedure report has been included in a sealed envelope for you to review at your convenience later.  YOU SHOULD EXPECT: Some feelings of bloating in the abdomen. Passage of more gas than usual.  Walking can help get rid of the air that was put into your GI tract during the procedure and reduce the bloating. If you had a lower endoscopy (such as a colonoscopy or flexible sigmoidoscopy) you may notice spotting of blood in your stool or on the toilet paper. If you underwent a bowel prep for your procedure, you may not have a normal bowel movement for a few days.  Please Note:  You might notice some irritation and congestion in your nose or some drainage.  This is from the oxygen used during your procedure.  There is no need for concern and it should clear up in a day or so.  SYMPTOMS TO REPORT IMMEDIATELY:  Following lower endoscopy (colonoscopy or flexible sigmoidoscopy):  Excessive amounts of blood in the stool  Significant tenderness or worsening of abdominal pains  Swelling of the abdomen that is new, acute  Fever of 100F or higher  For urgent or emergent issues, a gastroenterologist can be reached at any hour by calling [phone]. Do not use MyChart messaging for urgent concerns.    DIET:  We do recommend a small meal at first, but then you may proceed to your regular  diet.  Drink plenty of fluids but you should avoid alcoholic beverages for 24 hours.  ACTIVITY:  You should plan to take it easy for the rest of today and you should NOT DRIVE or use heavy machinery until tomorrow (because of the sedation medicines used during the test).    FOLLOW UP: Our staff will call the number listed on your records the next business day following your procedure.  We will call around 7:15- 8:00 am to check on you and address any questions or concerns that you may have regarding the information given to you following your procedure. If we do not reach you, we will leave a message.     If any biopsies were taken you will be contacted by phone or by letter within the next 1-3 weeks.  Please call us at [phone] if you have not heard about the biopsies in 3 weeks.    SIGNATURES/CONFIDENTIALITY: You and/or your care partner have signed paperwork which will be entered into your electronic medical record.  These signatures attest to the fact that that the information above on your After Visit Summary has been reviewed and is understood.  Full responsibility of the confidentiality of this discharge information lies with you and/or your care-partner.

## 2023-08-13 NOTE — Progress Notes (Signed)
Called to room to assist during endoscopic procedure.  Patient ID and intended procedure confirmed with present staff. Received instructions for my participation in the procedure from the performing physician.  

## 2023-08-13 NOTE — Progress Notes (Signed)
[facility] History and Physical   Primary Care Physician:  Shireen Quan, DO   Reason for Procedure:  History of diverticulitis  Plan:    Surveillance colonoscopy with possible interventions as needed     HPI: Kari Rojas is a very pleasant 67 y.o. female here for surveillance colonoscopy for follow up of diverticulitis. Denies any nausea, vomiting, abdominal pain, melena or bright red blood per rectum  The risks and benefits as well as alternatives of endoscopic procedure(s) have been discussed and reviewed. All questions answered. The patient agrees to proceed.    Past Medical History:  Diagnosis Date   Allergy    Anemia    younger with menses    Depression    GAD (generalized anxiety disorder)    Gallstones    GERD (gastroesophageal reflux disease)    Hyperlipidemia    Osteoporosis     Past Surgical History:  Procedure Laterality Date   APPENDECTOMY     BREAST CYST EXCISION Right    40 + years ago, Benign   CHOLECYSTECTOMY     COLONOSCOPY     10 + yrs ago    HYSTERECTOMY ABDOMINAL WITH SALPINGO-OOPHORECTOMY     has one ovary    TONSILLECTOMY     UPPER GASTROINTESTINAL ENDOSCOPY      Prior to Admission medications   Medication Sig Start Date End Date Taking? Authorizing Provider  escitalopram (LEXAPRO) 10 MG tablet Take 10 mg by mouth daily.   Yes [provider]  lovastatin (MEVACOR) 10 MG tablet 1 tablet with the evening meal Orally Once a day for 30 days   Yes [provider]  Multiple Vitamins-Minerals (ONE-A-DAY WOMENS PO) Take 1 tablet by mouth daily.   Yes [provider]  lovastatin (MEVACOR) 10 MG tablet TAKE 1 TABLET (10 MG TOTAL) BY MOUTH DAILY FOR 90 DOSES 07/30/22 05/12/23  Tysinger, Kermit Balo, PA-C  omeprazole (PRILOSEC) 10 MG capsule Take 10 mg by mouth daily as needed (Indigestion).    [provider]    Current Outpatient Medications  Medication Sig Dispense Refill   escitalopram  (LEXAPRO) 10 MG tablet Take 10 mg by mouth daily.     lovastatin (MEVACOR) 10 MG tablet 1 tablet with the evening meal Orally Once a day for 30 days     Multiple Vitamins-Minerals (ONE-A-DAY WOMENS PO) Take 1 tablet by mouth daily.     lovastatin (MEVACOR) 10 MG tablet TAKE 1 TABLET (10 MG TOTAL) BY MOUTH DAILY FOR 90 DOSES 90 tablet 0   omeprazole (PRILOSEC) 10 MG capsule Take 10 mg by mouth daily as needed (Indigestion).     Current Facility-Administered Medications  Medication Dose Route Frequency Provider Last Rate Last Admin   0.9 %  sodium chloride infusion  500 mL Intravenous Once Napoleon Form, MD        Allergies as of 08/13/2023 - Review Complete 08/13/2023  Allergen Reaction Noted   Latex  12/28/2019   Penicillins Hives 12/28/2019    Family History  Adopted: Yes    Social History   Socioeconomic History   Marital status: Divorced    Spouse name: Not on file   Number of children: 2   Years of education: Not on file   Highest education level: Not on file  Occupational History   Occupation: Careers adviser  Tobacco Use   Smoking status: Some Days    Current packs/day: 0.50    Average packs/day: 0.5 packs/day for 15.0 years (7.5 ttl pk-yrs)  Types: Cigarettes   Smokeless tobacco: Never  Vaping Use   Vaping status: Never Used  Substance and Sexual Activity   Alcohol use: Yes    Comment: occ   Drug use: Never   Sexual activity: Not Currently  Other Topics Concern   Not on file  Social History Narrative   Not on file   Social Determinants of Health   Financial Resource Strain: Not on file  Food Insecurity: No Food Insecurity (05/13/2023)   Hunger Vital Sign    Worried About Running Out of Food in the Last Year: Never true    Ran Out of Food in the Last Year: Never true  Transportation Needs: No Transportation Needs (05/13/2023)   PRAPARE - Administrator, Civil Service (Medical): No    Lack of Transportation (Non-Medical): No  Physical  Activity: Not on file  Stress: Not on file  Social Connections: Not on file  Intimate Partner Violence: Not At Risk (05/13/2023)   Humiliation, Afraid, Rape, and Kick questionnaire    Fear of Current or Ex-Partner: No    Emotionally Abused: No    Physically Abused: No    Sexually Abused: No    Review of Systems:  All other review of systems negative except as mentioned in the HPI.  Physical Exam: Vital signs in last 24 hours: BP 119/80   Pulse 70   Temp 97.9 F (36.6 C)   Resp 16   Ht 5\' 4"  (1.626 m)   Wt 154 lb (69.9 kg)   SpO2 100%   PF 100 L/min   BMI 26.43 kg/m  General:   Alert, NAD Lungs:  Clear .   Heart:  Regular rate and rhythm Abdomen:  Soft, nontender and nondistended. Neuro/Psych:  Alert and cooperative. Normal mood and affect. A and O x 3  Reviewed labs, radiology imaging, old records and pertinent past GI work up  Patient is appropriate for planned procedure(s) and anesthesia in an ambulatory setting   K. Scherry Ran , MD [phone]

## 2023-08-14 ENCOUNTER — Telehealth: Payer: Self-pay

## 2023-08-14 NOTE — Telephone Encounter (Signed)
  Follow up Call-     08/13/2023    8:15 AM  Call back number  Post procedure Call Back phone  # [phone]  Permission to leave phone message Yes     Patient questions:  Do you have a fever, pain , or abdominal swelling? No. Pain Score  0 *  Have you tolerated food without any problems? Yes.    Have you been able to return to your normal activities? Yes.    Do you have any questions about your discharge instructions: Diet   No. Medications  No. Follow up visit  No.  Do you have questions or concerns about your Care? No.  Actions: * If pain score is 4 or above: No action needed, pain <4.

## 2023-08-15 LAB — SURGICAL PATHOLOGY

## 2023-08-23 ENCOUNTER — Encounter: Payer: Self-pay | Admitting: Gastroenterology

## 2023-09-06 ENCOUNTER — Ambulatory Visit
Admission: RE | Admit: 2023-09-06 | Discharge: 2023-09-06 | Disposition: A | Discharge status: Home / Self Care | Payer: 59 | Source: Ambulatory Visit | Attending: Family Medicine | Admitting: Family Medicine

## 2023-09-06 DIAGNOSIS — Z1231 Encounter for screening mammogram for malignant neoplasm of breast: Secondary | ICD-10-CM

## 2024-01-06 ENCOUNTER — Other Ambulatory Visit: Payer: Self-pay | Admitting: *Deleted

## 2024-08-12 ENCOUNTER — Other Ambulatory Visit: Payer: Self-pay | Admitting: Physician Assistant

## 2024-08-12 DIAGNOSIS — Z1231 Encounter for screening mammogram for malignant neoplasm of breast: Secondary | ICD-10-CM

## 2024-09-08 ENCOUNTER — Ambulatory Visit
Admission: RE | Admit: 2024-09-08 | Discharge: 2024-09-08 | Disposition: A | Discharge status: Home / Self Care | Source: Ambulatory Visit | Attending: Physician Assistant | Admitting: Physician Assistant

## 2024-09-08 DIAGNOSIS — Z1231 Encounter for screening mammogram for malignant neoplasm of breast: Secondary | ICD-10-CM
